# Patient Record
Sex: Female | Born: 1980 | Hispanic: No | Marital: Single | Smoking: Never smoker
Health system: Southern US, Community
[De-identification: ages and names within clinical notes are randomized; demographics above are authoritative.]

## PROBLEM LIST (undated history)

## (undated) DIAGNOSIS — F4312 Post-traumatic stress disorder, chronic: Secondary | ICD-10-CM

## (undated) DIAGNOSIS — G8929 Other chronic pain: Secondary | ICD-10-CM

## (undated) DIAGNOSIS — F329 Major depressive disorder, single episode, unspecified: Secondary | ICD-10-CM

## (undated) DIAGNOSIS — K30 Functional dyspepsia: Secondary | ICD-10-CM

## (undated) DIAGNOSIS — E7800 Pure hypercholesterolemia, unspecified: Secondary | ICD-10-CM

## (undated) DIAGNOSIS — F330 Major depressive disorder, recurrent, mild: Secondary | ICD-10-CM

## (undated) DIAGNOSIS — I10 Essential (primary) hypertension: Secondary | ICD-10-CM

## (undated) DIAGNOSIS — K5900 Constipation, unspecified: Secondary | ICD-10-CM

## (undated) DIAGNOSIS — R519 Headache, unspecified: Secondary | ICD-10-CM

## (undated) DIAGNOSIS — K219 Gastro-esophageal reflux disease without esophagitis: Secondary | ICD-10-CM

## (undated) DIAGNOSIS — R51 Headache: Secondary | ICD-10-CM

## (undated) DIAGNOSIS — F32A Depression, unspecified: Secondary | ICD-10-CM

## (undated) DIAGNOSIS — I499 Cardiac arrhythmia, unspecified: Secondary | ICD-10-CM

## (undated) DIAGNOSIS — G43019 Migraine without aura, intractable, without status migrainosus: Secondary | ICD-10-CM

## (undated) DIAGNOSIS — D649 Anemia, unspecified: Secondary | ICD-10-CM

## (undated) DIAGNOSIS — G43909 Migraine, unspecified, not intractable, without status migrainosus: Secondary | ICD-10-CM

## (undated) DIAGNOSIS — F419 Anxiety disorder, unspecified: Secondary | ICD-10-CM

## (undated) DIAGNOSIS — M549 Dorsalgia, unspecified: Secondary | ICD-10-CM

## (undated) DIAGNOSIS — R0600 Dyspnea, unspecified: Secondary | ICD-10-CM

## (undated) HISTORY — DX: Pure hypercholesterolemia, unspecified: E78.00

## (undated) HISTORY — DX: Migraine, unspecified, not intractable, without status migrainosus: G43.909

## (undated) HISTORY — DX: Major depressive disorder, recurrent, mild: F33.0

## (undated) HISTORY — DX: Migraine without aura, intractable, without status migrainosus: G43.019

## (undated) HISTORY — PX: IUD REMOVAL: SHX5392

## (undated) HISTORY — DX: Essential (primary) hypertension: I10

## (undated) HISTORY — DX: Post-traumatic stress disorder, chronic: F43.12

---

## 2016-02-07 ENCOUNTER — Encounter (HOSPITAL_COMMUNITY): Payer: Self-pay

## 2016-02-07 ENCOUNTER — Emergency Department (HOSPITAL_COMMUNITY)
Admission: EM | Admit: 2016-02-07 | Discharge: 2016-02-07 | Disposition: A | Discharge status: Home / Self Care | Payer: Managed Care, Other (non HMO) | Attending: Emergency Medicine | Admitting: Emergency Medicine

## 2016-02-07 DIAGNOSIS — Z79899 Other long term (current) drug therapy: Secondary | ICD-10-CM | POA: Diagnosis not present

## 2016-02-07 DIAGNOSIS — R197 Diarrhea, unspecified: Secondary | ICD-10-CM | POA: Diagnosis not present

## 2016-02-07 DIAGNOSIS — R112 Nausea with vomiting, unspecified: Secondary | ICD-10-CM

## 2016-02-07 DIAGNOSIS — F17210 Nicotine dependence, cigarettes, uncomplicated: Secondary | ICD-10-CM | POA: Insufficient documentation

## 2016-02-07 HISTORY — DX: Anemia, unspecified: D64.9

## 2016-02-07 LAB — URINALYSIS, ROUTINE W REFLEX MICROSCOPIC
BILIRUBIN URINE: NEGATIVE
Glucose, UA: NEGATIVE mg/dL
Hgb urine dipstick: NEGATIVE
KETONES UR: NEGATIVE mg/dL
LEUKOCYTES UA: NEGATIVE
NITRITE: NEGATIVE
PROTEIN: NEGATIVE mg/dL
Specific Gravity, Urine: 1.022 (ref 1.005–1.030)
pH: 6.5 (ref 5.0–8.0)

## 2016-02-07 LAB — CBC
HEMATOCRIT: 36 % (ref 36.0–46.0)
Hemoglobin: 12.2 g/dL (ref 12.0–15.0)
MCH: 29.7 pg (ref 26.0–34.0)
MCHC: 33.9 g/dL (ref 30.0–36.0)
MCV: 87.6 fL (ref 78.0–100.0)
PLATELETS: 414 10*3/uL — AB (ref 150–400)
RBC: 4.11 MIL/uL (ref 3.87–5.11)
RDW: 13.1 % (ref 11.5–15.5)
WBC: 7.3 10*3/uL (ref 4.0–10.5)

## 2016-02-07 LAB — COMPREHENSIVE METABOLIC PANEL
ALK PHOS: 48 U/L (ref 38–126)
ALT: 11 U/L — ABNORMAL LOW (ref 14–54)
AST: 14 U/L — AB (ref 15–41)
Albumin: 3.9 g/dL (ref 3.5–5.0)
Anion gap: 7 (ref 5–15)
BILIRUBIN TOTAL: 0.3 mg/dL (ref 0.3–1.2)
BUN: 9 mg/dL (ref 6–20)
CALCIUM: 9.3 mg/dL (ref 8.9–10.3)
CO2: 24 mmol/L (ref 22–32)
CREATININE: 0.84 mg/dL (ref 0.44–1.00)
Chloride: 102 mmol/L (ref 101–111)
GFR calc Af Amer: >60 mL/min (ref >60)
Glucose, Bld: 135 mg/dL — ABNORMAL HIGH (ref 65–99)
POTASSIUM: 3.7 mmol/L (ref 3.5–5.1)
Sodium: 133 mmol/L — ABNORMAL LOW (ref 135–145)
TOTAL PROTEIN: 8.1 g/dL (ref 6.5–8.1)

## 2016-02-07 LAB — PREGNANCY, URINE: PREG TEST UR: NEGATIVE

## 2016-02-07 LAB — LIPASE, BLOOD: Lipase: 13 U/L (ref 11–51)

## 2016-02-07 MED ORDER — DICYCLOMINE HCL 10 MG PO CAPS
10.0000 mg | ORAL_CAPSULE | Freq: Once | ORAL | Status: AC
  Administered 2016-02-07: 10 mg via ORAL
  Filled 2016-02-07: qty 1

## 2016-02-07 MED ORDER — ONDANSETRON 4 MG PO TBDP
4.0000 mg | ORAL_TABLET | Freq: Once | ORAL | Status: AC | PRN
  Administered 2016-02-07: 4 mg via ORAL
  Filled 2016-02-07: qty 1

## 2016-02-07 MED ORDER — DICYCLOMINE HCL 20 MG PO TABS: 20.0000 mg | ORAL_TABLET | Freq: Two times a day (BID) | ORAL | Status: DC

## 2016-02-07 MED ORDER — ONDANSETRON HCL 4 MG PO TABS: 4.0000 mg | ORAL_TABLET | Freq: Four times a day (QID) | ORAL | Status: DC

## 2016-02-07 NOTE — ED Notes (Signed)
Per EMS patient c/o sudden onset Nausea x2 hours ago.  Per EMS patient vomited in truck, patient stated it was due to the motion of the truck.  Per EMS patient is clammy to the touch.  Per EMS patient recently diagnosed anemia.  Hx of anxiety and depression.  VS en route 142/86, hR 70, RR 18.

## 2016-02-07 NOTE — ED Provider Notes (Signed)
CSN: [id number]     Arrival date & time 02/07/16  0609 History   First MD Initiated Contact with Patient 02/07/16 [phone]     Chief Complaint  Patient presents with  . Nausea  . Emesis     (Consider location/radiation/quality/duration/timing/severity/associated sxs/prior Treatment) HPI Deanna Ballard is a 35 y.o. female reports she was recently diagnosed with anemia here for evaluation of nausea and vomiting. Patient reports yesterday evening she began to have some central cramping in her mid abdomen that she characterized as "someone grabbing me". She reports this sensation settled in her epigastric area. She reports at approximately 1:30 AM she experienced nausea with vomiting, nonbloody and nonbilious. She also reports 3 loose stools, no melena or hematochezia and she reports prior to this event occurring, she had a gyro. Reports associated hot and cold flashes at home with subjective fevers. She denies any headache, vision changes, cough, chest pain or shortness of breath, urinary symptoms, back pain, unusual vaginal bleeding or discharge. Last normal period last month. Denies any excessive alcohol drinking.  Past Medical History  Diagnosis Date  . Anemia    History reviewed. No pertinent past surgical history. No family history on file. Social History  Substance Use Topics  . Smoking status: Current Every Day Smoker    Types: Cigarettes  . Smokeless tobacco: None  . Alcohol Use: Yes     Comment: socially   OB History    No data available     Review of Systems A 10 point review of systems was completed and was negative except for pertinent positives and negatives as mentioned in the history of present illness     Allergies  Review of patient's allergies indicates no known allergies.  Home Medications   Prior to Admission medications   Medication Sig Start Date End Date Taking? Authorizing Provider  Adapalene 0.3 % gel Apply 1 application topically daily. 01/13/16  Yes  Historical Provider, MD  BELVIQ 10 MG TABS Take 10 mg by mouth daily. 01/18/16  Yes Historical Provider, MD  clonazePAM (KLONOPIN) 0.5 MG tablet Take 1 tablet by mouth at bedtime. 12/08/15  Yes Historical Provider, MD  LORYNA 3-0.02 MG tablet Take 1 tablet by mouth at bedtime. 11/28/15  Yes Historical Provider, MD  oxybutynin (DITROPAN-XL) 5 MG 24 hr tablet Take 1 tablet by mouth at bedtime. 01/13/16  Yes Historical Provider, MD  sertraline (ZOLOFT) 50 MG tablet Take 50 mg by mouth daily. 01/03/16  Yes Historical Provider, MD  dicyclomine (BENTYL) 20 MG tablet Take 1 tablet (20 mg total) by mouth 2 (two) times daily. 02/07/16   Comer Locket, PA-C  ondansetron (ZOFRAN) 4 MG tablet Take 1 tablet (4 mg total) by mouth every 6 (six) hours. 02/07/16   Comer Locket, PA-C  Vitamin D, Ergocalciferol, (DRISDOL) 50000 units CAPS capsule Take 1 capsule by mouth every 7 (seven) days. 02/05/16   Historical Provider, MD   BP 117/76 mmHg  Pulse 93  Temp(Src) 98.3 F (36.8 C) (Oral)  Resp 18  SpO2 100%  LMP 01/04/2016 (Approximate) Physical Exam  Constitutional: She is oriented to person, place, and time. She appears well-developed and well-nourished.  Overweight African-American female  HENT:  Head: Normocephalic and atraumatic.  Mouth/Throat: Oropharynx is clear and moist.  Eyes: Conjunctivae are normal. Pupils are equal, round, and reactive to light. Right eye exhibits no discharge. Left eye exhibits no discharge. No scleral icterus.  Neck: Neck supple.  Cardiovascular: Normal rate, regular rhythm and normal heart sounds.  Pulmonary/Chest: Effort normal and breath sounds normal. No respiratory distress. She has no wheezes. She has no rales.  Abdominal: Soft.  Mild diffuse tenderness in epigastrium, mid abdomen and suprapubic region. Abdomen is otherwise soft, nondistended without rebound or guarding. No pulsatile masses or other deformities noted.  Musculoskeletal: She exhibits no tenderness.   Neurological: She is alert and oriented to person, place, and time.  Cranial Nerves II-XII grossly intact  Skin: Skin is warm and dry. No rash noted.  Psychiatric: She has a normal mood and affect.  Nursing note and vitals reviewed.   ED Course  Procedures (including critical care time) Labs Review Labs Reviewed  COMPREHENSIVE METABOLIC PANEL - Abnormal; Notable for the following:    Sodium 133 (*)    Glucose, Bld 135 (*)    AST 14 (*)    ALT 11 (*)    All other components within normal limits  CBC - Abnormal; Notable for the following:    Platelets 414 (*)    All other components within normal limits  URINALYSIS, ROUTINE W REFLEX MICROSCOPIC (NOT AT [facility]) - Abnormal; Notable for the following:    APPearance HAZY (*)    All other components within normal limits  LIPASE, BLOOD  PREGNANCY, URINE    Imaging Review No results found. I have personally reviewed and evaluated these images and lab results as part of my medical decision-making.   EKG Interpretation None     Meds given in ED:  Medications  ondansetron (ZOFRAN-ODT) disintegrating tablet 4 mg (4 mg Oral Given 02/07/16 0825)  dicyclomine (BENTYL) capsule 10 mg (10 mg Oral Given 02/07/16 1001)    Discharge Medication List as of 02/07/2016 11:28 AM    START taking these medications   Details  dicyclomine (BENTYL) 20 MG tablet Take 1 tablet (20 mg total) by mouth 2 (two) times daily., Starting 02/07/2016, Until Discontinued, Print    ondansetron (ZOFRAN) 4 MG tablet Take 1 tablet (4 mg total) by mouth every 6 (six) hours., Starting 02/07/2016, Until Discontinued, Print       Filed Vitals:   02/07/16 0623 02/07/16 0847 02/07/16 1122  BP: 137/99 120/73 117/76  Pulse: 69 88 93  Temp: 98.3 F (36.8 C)    TempSrc: Oral    Resp: 18 18 18   SpO2: 100% 99% 100%    MDM  Patient presents with nausea, vomiting and diarrhea over the past 24 hours. Physical exam is reassuring. Abdominal exam without any evidence of  acute or surgical abdomen. On arrival, she is afebrile and hemodynamically stable. Overall she appears well and nontoxic. Screening labs are unremarkable, urine without evidence of infection. Pregnancy test is negative. Patient reports symptoms have greatly improved with Zofran and Bentyl. She is tolerating oral fluids. Plan to discharge with prescriptions for both and follow-up with PCP for reevaluation next week. Discussed strict return precautions. She verbalizes understanding and agrees with this plan as well as subsequent discharge. Final diagnoses:  Nausea vomiting and diarrhea        Comer Locket, PA-C 02/07/16 1341  Harvel Quale, MD 02/14/16 1447

## 2016-02-07 NOTE — Discharge Instructions (Signed)
There does not appear to be an emergent cause for your symptoms at this time. Please take your medications as prescribed to help with your nausea and vomiting and abdominal discomfort. Follow-up with your doctor next week for reevaluation. Return to ED for any new or worsening symptoms as we discussed.  Nausea and Vomiting Nausea is a sick feeling that often comes before throwing up (vomiting). Vomiting is a reflex where stomach contents come out of your mouth. Vomiting can cause severe loss of body fluids (dehydration). Children and elderly adults can become dehydrated quickly, especially if they also have diarrhea. Nausea and vomiting are symptoms of a condition or disease. It is important to find the cause of your symptoms. CAUSES   Direct irritation of the stomach lining. This irritation can result from increased acid production (gastroesophageal reflux disease), infection, food poisoning, taking certain medicines (such as nonsteroidal anti-inflammatory drugs), alcohol use, or tobacco use.  Signals from the brain.These signals could be caused by a headache, heat exposure, an inner ear disturbance, increased pressure in the brain from injury, infection, a tumor, or a concussion, pain, emotional stimulus, or metabolic problems.  An obstruction in the gastrointestinal tract (bowel obstruction).  Illnesses such as diabetes, hepatitis, gallbladder problems, appendicitis, kidney problems, cancer, sepsis, atypical symptoms of a heart attack, or eating disorders.  Medical treatments such as chemotherapy and radiation.  Receiving medicine that makes you sleep (general anesthetic) during surgery. DIAGNOSIS Your caregiver may ask for tests to be done if the problems do not improve after a few days. Tests may also be done if symptoms are severe or if the reason for the nausea and vomiting is not clear. Tests may include:  Urine tests.  Blood tests.  Stool tests.  Cultures (to look for evidence of  infection).  X-rays or other imaging studies. Test results can help your caregiver make decisions about treatment or the need for additional tests. TREATMENT You need to stay well hydrated. Drink frequently but in small amounts.You may wish to drink water, sports drinks, clear broth, or eat frozen ice pops or gelatin dessert to help stay hydrated.When you eat, eating slowly may help prevent nausea.There are also some antinausea medicines that may help prevent nausea. HOME CARE INSTRUCTIONS   Take all medicine as directed by your caregiver.  If you do not have an appetite, do not force yourself to eat. However, you must continue to drink fluids.  If you have an appetite, eat a normal diet unless your caregiver tells you differently.  Eat a variety of complex carbohydrates (rice, wheat, potatoes, bread), lean meats, yogurt, fruits, and vegetables.  Avoid high-fat foods because they are more difficult to digest.  Drink enough water and fluids to keep your urine clear or pale yellow.  If you are dehydrated, ask your caregiver for specific rehydration instructions. Signs of dehydration may include:  Severe thirst.  Dry lips and mouth.  Dizziness.  Dark urine.  Decreasing urine frequency and amount.  Confusion.  Rapid breathing or pulse. SEEK IMMEDIATE MEDICAL CARE IF:   You have blood or brown flecks (like coffee grounds) in your vomit.  You have black or bloody stools.  You have a severe headache or stiff neck.  You are confused.  You have severe abdominal pain.  You have chest pain or trouble breathing.  You do not urinate at least once every 8 hours.  You develop cold or clammy skin.  You continue to vomit for longer than 24 to 48 hours.  You have a fever. MAKE SURE YOU:   Understand these instructions.  Will watch your condition.  Will get help right away if you are not doing well or get worse.   This information is not intended to replace advice  given to you by your health care provider. Make sure you discuss any questions you have with your health care provider.   Document Released: 09/05/2005 Document Revised: 11/28/2011 Document Reviewed: 02/02/2011 Elsevier Interactive Patient Education [employer].

## 2016-02-07 NOTE — ED Notes (Signed)
Lab draw unsuccessful x2.  

## 2016-09-05 ENCOUNTER — Other Ambulatory Visit: Payer: Self-pay | Admitting: Obstetrics and Gynecology

## 2016-09-06 NOTE — H&P (Signed)
Deanna Ballard is a 35 y.o. female  G:0 who presents for removal of a right ovary  and an endometrial polyp because of menorrhagia and a large right dermoid cyst.  Since menarche the patient gives a history of heavy menses that,  early on,  was managed with oral contraceptives.  In recent years the bleeding has resumed with her flow lasting up to 2 weeks and having to change her pad every hour to once a day as it tapers.  She admits to passing large clots and at times going up to 3 months without a period.  With her flow she has cramping rated 9-10 on a 10 point pain scale that is relieved with Naproxen.  She denies inter-menstrual bleeding, changes in bowel or bladder function or dyspareunia.  A pelvic ultrasound, November 2017 showed: uterus: anteverted with features of adenomyosis measuring, 6.69 x 3.41 x 3.91 cm, endometrium: 0.44 cm with a trace of free fluid in the cavity and focal thickening vs polyp measuring 1.0 cm; uterine fibroid 0.99 cm; left ovary: 3.68 cm and right ovary: 7.22 cm with a large mass containing solid and cystic components ? dermoid measuring: 7.6 cm.  Patient's gonorrhea and chlamydia cultures were negative, CA-125 normal with an elevated CEA at 3.1.  A removal of the ovarian cyst was recommended however,  the patient prefers to have her entire right ovary and tube removed along with the endometrial mass and has consented to the same.   Past Medical History  OB History: G: 0  GYN History: menarche: 35 YO    LMP: 08/27/16    Contracepton no method  The patient denies history of sexually transmitted disease.  Denies history of abnormal PAP smea.   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   ROS: wears glasses, has shortness of breath (with and without exertion)-awaiting medical clearance and  palpitations  but denies,  headache, vision changes, nasal congestion, dysphagia, tinnitus, dizziness, hoarseness, cough,  chest pain, shortness of breath, nausea, vomiting, diarrhea,constipation,  urinary frequency, urgency  dysuria, hematuria, vaginitis symptoms,  swelling of joints,easy bruising,  myalgias, arthralgias, skin rashes, unexplained weight loss and except as is mentioned in the history of present illness, patient's review of systems is otherwise negative.  Physical Exam  Bp:  122/72    P: 96 bpm   R: 20    Temperature: 98.3 degrees F   Weight: 263 lbs.  Height: 5' 9.5 "  BMI  Neck: supple without masses or thyromegaly Lungs: clear to auscultation Heart: regular rate and rhythm Abdomen: soft, right lower quadrant tenderness and no organomegaly Pelvic:EGBUS- wnl; vagina-normal rugae; uterus-normal size, cervix without lesions or motion tenderness; adnexae-right tenderness with fullness   Extremities:  no clubbing, cyanosis or edema   Assesment:  Right Dermoid Cyst            Endometrial Mass  Menorrhagia   Disposition:  A discussion was held with patient regarding the indication for her procedure(s) along with the risks, which include but are not limited to: reaction to anesthesia, damage to adjacent organs (to include perforation), infection and  excessive bleeding. The patient verbalized understanding of these risks and has consented to proceed with a Laparoscopic Right  Salpingo-Oophorectomy, Hysteroscopic Removal of Endometrial Mass and Placement of a Mirena IUD at [place] on September 26, 2016 at 12:45 p.m.  CSN# [id number]   Jhordan Kinter J. Florene Glen, PA-C  for Dr. Dede Query. Rivard

## 2016-09-13 NOTE — Patient Instructions (Addendum)
Your procedure is scheduled on:  Monday, Jan. 8, 2018  Enter through the [employer] [facility] at:  12:15 PM  Pick up the phone at the desk and dial [phone].  Call this number if you have problems the morning of surgery: [phone].  Remember: Do NOT eat food:  After Midnight Sunday, Jan. 7, 2018  Do NOT drink clear liquids after:  9:30 AM day of surgery  Take these medicines the morning of surgery with a SIP OF WATER: None  Stop ALL herbal medications at this time   Do NOT wear jewelry (body piercing), metal hair clips/bobby pins, make-up, or nail polish. Do NOT wear lotions, powders, or perfumes.  You may wear deodorant. Do NOT shave for 48 hours prior to surgery. Do NOT bring valuables to the hospital. Contacts, dentures, or bridgework may not be worn into surgery.  Have a responsible adult drive you home and stay with you for 24 hours after your procedure

## 2016-09-14 ENCOUNTER — Other Ambulatory Visit: Payer: Self-pay

## 2016-09-14 ENCOUNTER — Encounter (HOSPITAL_COMMUNITY)
Admission: RE | Admit: 2016-09-14 | Discharge: 2016-09-14 | Disposition: A | Discharge status: Home / Self Care | Payer: Managed Care, Other (non HMO) | Source: Ambulatory Visit | Attending: Obstetrics and Gynecology | Admitting: Obstetrics and Gynecology

## 2016-09-14 ENCOUNTER — Encounter (HOSPITAL_COMMUNITY): Payer: Self-pay

## 2016-09-14 DIAGNOSIS — R51 Headache: Secondary | ICD-10-CM | POA: Diagnosis not present

## 2016-09-14 DIAGNOSIS — D649 Anemia, unspecified: Secondary | ICD-10-CM | POA: Diagnosis not present

## 2016-09-14 DIAGNOSIS — K219 Gastro-esophageal reflux disease without esophagitis: Secondary | ICD-10-CM | POA: Insufficient documentation

## 2016-09-14 DIAGNOSIS — Z01812 Encounter for preprocedural laboratory examination: Secondary | ICD-10-CM | POA: Insufficient documentation

## 2016-09-14 DIAGNOSIS — R009 Unspecified abnormalities of heart beat: Secondary | ICD-10-CM | POA: Insufficient documentation

## 2016-09-14 DIAGNOSIS — K30 Functional dyspepsia: Secondary | ICD-10-CM | POA: Insufficient documentation

## 2016-09-14 DIAGNOSIS — R0600 Dyspnea, unspecified: Secondary | ICD-10-CM | POA: Insufficient documentation

## 2016-09-14 DIAGNOSIS — F329 Major depressive disorder, single episode, unspecified: Secondary | ICD-10-CM | POA: Insufficient documentation

## 2016-09-14 DIAGNOSIS — Z01810 Encounter for preprocedural cardiovascular examination: Secondary | ICD-10-CM | POA: Insufficient documentation

## 2016-09-14 DIAGNOSIS — R000 Tachycardia, unspecified: Secondary | ICD-10-CM | POA: Diagnosis not present

## 2016-09-14 HISTORY — DX: Depression, unspecified: F32.A

## 2016-09-14 HISTORY — DX: Headache: R51

## 2016-09-14 HISTORY — DX: Major depressive disorder, single episode, unspecified: F32.9

## 2016-09-14 HISTORY — DX: Gastro-esophageal reflux disease without esophagitis: K21.9

## 2016-09-14 HISTORY — DX: Dyspnea, unspecified: R06.00

## 2016-09-14 HISTORY — DX: Headache, unspecified: R51.9

## 2016-09-14 HISTORY — DX: Cardiac arrhythmia, unspecified: I49.9

## 2016-09-14 HISTORY — DX: Functional dyspepsia: K30

## 2016-09-14 LAB — CBC
HCT: 36.1 % (ref 36.0–46.0)
HEMOGLOBIN: 12.3 g/dL (ref 12.0–15.0)
MCH: 30.5 pg (ref 26.0–34.0)
MCHC: 34.1 g/dL (ref 30.0–36.0)
MCV: 89.6 fL (ref 78.0–100.0)
PLATELETS: 392 10*3/uL (ref 150–400)
RBC: 4.03 MIL/uL (ref 3.87–5.11)
RDW: 13.5 % (ref 11.5–15.5)
WBC: 5.3 10*3/uL (ref 4.0–10.5)

## 2016-09-26 ENCOUNTER — Encounter (HOSPITAL_COMMUNITY): Payer: Self-pay

## 2016-09-26 ENCOUNTER — Ambulatory Visit (HOSPITAL_COMMUNITY)
Admission: RE | Admit: 2016-09-26 | Discharge: 2016-09-26 | Disposition: A | Discharge status: Home / Self Care | Payer: Managed Care, Other (non HMO) | Source: Ambulatory Visit | Attending: Obstetrics and Gynecology | Admitting: Obstetrics and Gynecology

## 2016-09-26 ENCOUNTER — Ambulatory Visit (HOSPITAL_COMMUNITY): Payer: Managed Care, Other (non HMO) | Admitting: Anesthesiology

## 2016-09-26 ENCOUNTER — Encounter (HOSPITAL_COMMUNITY)
Admission: RE | Disposition: A | Discharge status: Home / Self Care | Payer: Self-pay | Source: Ambulatory Visit | Attending: Obstetrics and Gynecology

## 2016-09-26 DIAGNOSIS — K219 Gastro-esophageal reflux disease without esophagitis: Secondary | ICD-10-CM | POA: Diagnosis not present

## 2016-09-26 DIAGNOSIS — D279 Benign neoplasm of unspecified ovary: Secondary | ICD-10-CM | POA: Diagnosis present

## 2016-09-26 DIAGNOSIS — N800 Endometriosis of uterus: Secondary | ICD-10-CM | POA: Insufficient documentation

## 2016-09-26 DIAGNOSIS — N920 Excessive and frequent menstruation with regular cycle: Secondary | ICD-10-CM | POA: Diagnosis not present

## 2016-09-26 DIAGNOSIS — N840 Polyp of corpus uteri: Secondary | ICD-10-CM | POA: Diagnosis not present

## 2016-09-26 DIAGNOSIS — F329 Major depressive disorder, single episode, unspecified: Secondary | ICD-10-CM | POA: Diagnosis not present

## 2016-09-26 HISTORY — PX: INTRAUTERINE DEVICE (IUD) INSERTION: SHX5877

## 2016-09-26 HISTORY — PX: HYSTEROSCOPY WITH D & C: SHX1775

## 2016-09-26 HISTORY — PX: ROBOTIC ASSISTED LAPAROSCOPIC OVARIAN CYSTECTOMY: SHX6081

## 2016-09-26 LAB — PREGNANCY, URINE: PREG TEST UR: NEGATIVE

## 2016-09-26 SURGERY — ROBOTIC ASSISTED LAPAROSCOPIC OVARIAN CYSTECTOMY
Anesthesia: General | Site: Uterus | Laterality: Right

## 2016-09-26 MED ORDER — BUPIVACAINE HCL (PF) 0.25 % IJ SOLN
INTRAMUSCULAR | Status: AC
  Filled 2016-09-26: qty 30

## 2016-09-26 MED ORDER — ROCURONIUM BROMIDE 100 MG/10ML IV SOLN
INTRAVENOUS | Status: DC | PRN
Start: 2016-09-26 — End: 2016-09-26
  Administered 2016-09-26: 20 mg via INTRAVENOUS
  Administered 2016-09-26 (×3): 10 mg via INTRAVENOUS
  Administered 2016-09-26: 50 mg via INTRAVENOUS
  Administered 2016-09-26: 10 mg via INTRAVENOUS
  Administered 2016-09-26: 5 mg via INTRAVENOUS

## 2016-09-26 MED ORDER — KETOROLAC TROMETHAMINE 30 MG/ML IJ SOLN
INTRAMUSCULAR | Status: AC
  Filled 2016-09-26: qty 2

## 2016-09-26 MED ORDER — LACTATED RINGERS IV SOLN
INTRAVENOUS | Status: DC
  Administered 2016-09-26: 15:00:00 via INTRAVENOUS
  Administered 2016-09-26: 125 mL/h via INTRAVENOUS
  Administered 2016-09-26: 13:00:00 via INTRAVENOUS

## 2016-09-26 MED ORDER — HYDROMORPHONE HCL 1 MG/ML IJ SOLN
0.2500 mg | INTRAMUSCULAR | Status: DC | PRN
  Administered 2016-09-26 (×2): 0.25 mg via INTRAVENOUS

## 2016-09-26 MED ORDER — NEOSTIGMINE METHYLSULFATE 10 MG/10ML IV SOLN
INTRAVENOUS | Status: DC | PRN
  Administered 2016-09-26: 4 mg via INTRAVENOUS

## 2016-09-26 MED ORDER — OXYCODONE-ACETAMINOPHEN 5-325 MG PO TABS: 1.0000 | ORAL_TABLET | ORAL | 0 refills | Status: DC | PRN

## 2016-09-26 MED ORDER — PROMETHAZINE HCL 25 MG/ML IJ SOLN: 6.2500 mg | INTRAMUSCULAR | Status: DC | PRN

## 2016-09-26 MED ORDER — SODIUM CHLORIDE 0.9 % IJ SOLN
INTRAMUSCULAR | Status: AC
  Filled 2016-09-26: qty 60

## 2016-09-26 MED ORDER — HYDROMORPHONE HCL 1 MG/ML IJ SOLN
INTRAMUSCULAR | Status: AC
  Filled 2016-09-26: qty 1

## 2016-09-26 MED ORDER — LACTATED RINGERS IR SOLN
Status: DC | PRN
  Administered 2016-09-26: 3000 mL

## 2016-09-26 MED ORDER — MIDAZOLAM HCL 2 MG/2ML IJ SOLN
INTRAMUSCULAR | Status: DC | PRN
  Administered 2016-09-26: 2 mg via INTRAVENOUS

## 2016-09-26 MED ORDER — PROPOFOL 10 MG/ML IV BOLUS
INTRAVENOUS | Status: DC | PRN
  Administered 2016-09-26: 200 mg via INTRAVENOUS

## 2016-09-26 MED ORDER — NEOSTIGMINE METHYLSULFATE 10 MG/10ML IV SOLN
INTRAVENOUS | Status: AC
  Filled 2016-09-26: qty 1

## 2016-09-26 MED ORDER — KETOROLAC TROMETHAMINE 30 MG/ML IJ SOLN: 30.0000 mg | Freq: Once | INTRAMUSCULAR | Status: DC | PRN

## 2016-09-26 MED ORDER — FENTANYL CITRATE (PF) 250 MCG/5ML IJ SOLN
INTRAMUSCULAR | Status: DC | PRN
  Administered 2016-09-26 (×2): 100 ug via INTRAVENOUS
  Administered 2016-09-26 (×2): 50 ug via INTRAVENOUS

## 2016-09-26 MED ORDER — CHLOROPROCAINE HCL 1 % IJ SOLN
INTRAMUSCULAR | Status: AC
  Filled 2016-09-26: qty 30

## 2016-09-26 MED ORDER — VASOPRESSIN 20 UNIT/ML IV SOLN
INTRAVENOUS | Status: DC | PRN
  Administered 2016-09-26: 10 mL via INTRAMUSCULAR

## 2016-09-26 MED ORDER — STERILE WATER FOR IRRIGATION IR SOLN
Status: DC | PRN
  Administered 2016-09-26: 1000 mL

## 2016-09-26 MED ORDER — GLYCOPYRROLATE 0.2 MG/ML IJ SOLN
INTRAMUSCULAR | Status: DC | PRN
  Administered 2016-09-26: 0.1 mg via INTRAVENOUS
  Administered 2016-09-26: .8 mg via INTRAVENOUS

## 2016-09-26 MED ORDER — DEXAMETHASONE SODIUM PHOSPHATE 10 MG/ML IJ SOLN
INTRAMUSCULAR | Status: AC
  Filled 2016-09-26: qty 1

## 2016-09-26 MED ORDER — ONDANSETRON HCL 4 MG/2ML IJ SOLN
INTRAMUSCULAR | Status: AC
  Filled 2016-09-26: qty 2

## 2016-09-26 MED ORDER — VASOPRESSIN 20 UNIT/ML IV SOLN
INTRAVENOUS | Status: AC
  Filled 2016-09-26: qty 1

## 2016-09-26 MED ORDER — CHLOROPROCAINE HCL 1 % IJ SOLN
INTRAMUSCULAR | Status: DC | PRN
  Administered 2016-09-26: 10 mL

## 2016-09-26 MED ORDER — PROPOFOL 10 MG/ML IV BOLUS
INTRAVENOUS | Status: AC
  Filled 2016-09-26: qty 20

## 2016-09-26 MED ORDER — MIDAZOLAM HCL 2 MG/2ML IJ SOLN
INTRAMUSCULAR | Status: AC
  Filled 2016-09-26: qty 2

## 2016-09-26 MED ORDER — OXYMETAZOLINE HCL 0.05 % NA SOLN
1.0000 | Freq: Two times a day (BID) | NASAL | Status: DC
  Administered 2016-09-26: 1 via NASAL
  Filled 2016-09-26: qty 15

## 2016-09-26 MED ORDER — FENTANYL CITRATE (PF) 100 MCG/2ML IJ SOLN
INTRAMUSCULAR | Status: AC
  Filled 2016-09-26: qty 2

## 2016-09-26 MED ORDER — LIDOCAINE HCL (CARDIAC) 20 MG/ML IV SOLN
INTRAVENOUS | Status: DC | PRN
  Administered 2016-09-26: 100 mg via INTRAVENOUS

## 2016-09-26 MED ORDER — ROPIVACAINE HCL 5 MG/ML IJ SOLN
INTRAMUSCULAR | Status: AC
  Filled 2016-09-26: qty 60

## 2016-09-26 MED ORDER — DEXAMETHASONE SODIUM PHOSPHATE 4 MG/ML IJ SOLN
INTRAMUSCULAR | Status: DC | PRN
  Administered 2016-09-26: 10 mg via INTRAVENOUS

## 2016-09-26 MED ORDER — GLYCOPYRROLATE 0.2 MG/ML IJ SOLN
INTRAMUSCULAR | Status: AC
  Filled 2016-09-26: qty 4

## 2016-09-26 MED ORDER — SCOPOLAMINE 1 MG/3DAYS TD PT72
1.0000 | MEDICATED_PATCH | Freq: Once | TRANSDERMAL | Status: DC
  Administered 2016-09-26: 1.5 mg via TRANSDERMAL

## 2016-09-26 MED ORDER — ONDANSETRON HCL 4 MG/2ML IJ SOLN
INTRAMUSCULAR | Status: DC | PRN
  Administered 2016-09-26: 4 mg via INTRAVENOUS

## 2016-09-26 MED ORDER — SODIUM CHLORIDE 0.9 % IV SOLN
INTRAVENOUS | Status: DC | PRN
  Administered 2016-09-26: 100 mL

## 2016-09-26 MED ORDER — SCOPOLAMINE 1 MG/3DAYS TD PT72
MEDICATED_PATCH | TRANSDERMAL | Status: AC
  Administered 2016-09-26: 1.5 mg via TRANSDERMAL
  Filled 2016-09-26: qty 1

## 2016-09-26 MED ORDER — HYDROMORPHONE HCL 1 MG/ML IJ SOLN
INTRAMUSCULAR | Status: DC | PRN
  Administered 2016-09-26: 1 mg via INTRAVENOUS

## 2016-09-26 MED ORDER — HYDROMORPHONE HCL 1 MG/ML IJ SOLN
INTRAMUSCULAR | Status: AC
  Administered 2016-09-26: 0.25 mg via INTRAVENOUS
  Filled 2016-09-26: qty 1

## 2016-09-26 MED ORDER — KETOROLAC TROMETHAMINE 30 MG/ML IJ SOLN
INTRAMUSCULAR | Status: DC | PRN
  Administered 2016-09-26: 30 mg via INTRAVENOUS
  Administered 2016-09-26: 30 mg via INTRAMUSCULAR

## 2016-09-26 MED ORDER — FENTANYL CITRATE (PF) 250 MCG/5ML IJ SOLN
INTRAMUSCULAR | Status: AC
  Filled 2016-09-26: qty 5

## 2016-09-26 MED ORDER — IBUPROFEN 600 MG PO TABS: 600.0000 mg | ORAL_TABLET | Freq: Four times a day (QID) | ORAL | 0 refills | Status: DC | PRN

## 2016-09-26 MED ORDER — LIDOCAINE HCL (CARDIAC) 20 MG/ML IV SOLN
INTRAVENOUS | Status: AC
  Filled 2016-09-26: qty 5

## 2016-09-26 SURGICAL SUPPLY — 69 items
BAG DECANTER FOR FLEXI CONT (MISCELLANEOUS) ×8 IMPLANT
BARRIER ADHS 3X4 INTERCEED (GAUZE/BANDAGES/DRESSINGS) IMPLANT
BIPOLAR CUTTING LOOP 21FR (ELECTRODE)
BOOTIES KNEE HIGH SLOAN (MISCELLANEOUS) ×8 IMPLANT
CANISTER SUCT 3000ML (MISCELLANEOUS) ×4 IMPLANT
CATH FOLEY 3WAY  5CC 16FR (CATHETERS) ×2
CATH FOLEY 3WAY 5CC 16FR (CATHETERS) ×2 IMPLANT
CATH ROBINSON RED A/P 16FR (CATHETERS) IMPLANT
CLOTH BEACON ORANGE TIMEOUT ST (SAFETY) ×4 IMPLANT
CONT PATH 16OZ SNAP LID 3702 (MISCELLANEOUS) ×8 IMPLANT
CONTAINER PREFILL 10% NBF 60ML (FORM) ×8 IMPLANT
CORD BIPOLAR FORCEPS 12FT (ELECTRODE) ×4 IMPLANT
COVER BACK TABLE 60X90IN (DRAPES) ×8 IMPLANT
COVER TIP SHEARS 8 DVNC (MISCELLANEOUS) ×2 IMPLANT
COVER TIP SHEARS 8MM DA VINCI (MISCELLANEOUS) ×2
DECANTER SPIKE VIAL GLASS SM (MISCELLANEOUS) ×12 IMPLANT
DEFOGGER SCOPE WARMER CLEARIFY (MISCELLANEOUS) ×8 IMPLANT
DERMABOND ADVANCED (GAUZE/BANDAGES/DRESSINGS) ×2
DERMABOND ADVANCED .7 DNX12 (GAUZE/BANDAGES/DRESSINGS) ×2 IMPLANT
DRSG OPSITE POSTOP 3X4 (GAUZE/BANDAGES/DRESSINGS) ×4 IMPLANT
DURAPREP 26ML APPLICATOR (WOUND CARE) ×4 IMPLANT
ELECT COAG BIPOL BALL 21FR (ELECTRODE) IMPLANT
ELECT REM PT RETURN 9FT ADLT (ELECTROSURGICAL) ×4
ELECTRODE REM PT RTRN 9FT ADLT (ELECTROSURGICAL) ×2 IMPLANT
GAUZE VASELINE 3X9 (GAUZE/BANDAGES/DRESSINGS) IMPLANT
GLOVE BIOGEL PI IND STRL 7.0 (GLOVE) ×6 IMPLANT
GLOVE BIOGEL PI INDICATOR 7.0 (GLOVE) ×6
GLOVE ECLIPSE 6.5 STRL STRAW (GLOVE) ×20 IMPLANT
GOWN STRL REUS W/TWL LRG LVL3 (GOWN DISPOSABLE) ×8 IMPLANT
KIT ACCESSORY DA VINCI DISP (KITS) ×2
KIT ACCESSORY DVNC DISP (KITS) ×2 IMPLANT
LEGGING LITHOTOMY PAIR STRL (DRAPES) ×4 IMPLANT
LOOP CUTTING BIPOLAR 21FR (ELECTRODE) IMPLANT
NEEDLE SPNL 22GX3.5 QUINCKE BK (NEEDLE) ×8 IMPLANT
PACK ROBOT WH (CUSTOM PROCEDURE TRAY) ×4 IMPLANT
PACK ROBOTIC GOWN (GOWN DISPOSABLE) ×4 IMPLANT
PACK TRENDGUARD 450 HYBRID PRO (MISCELLANEOUS) ×2 IMPLANT
PACK TRENDGUARD 600 HYBRD PROC (MISCELLANEOUS) IMPLANT
PACK VAGINAL MINOR WOMEN LF (CUSTOM PROCEDURE TRAY) IMPLANT
PAD OB MATERNITY 4.3X12.25 (PERSONAL CARE ITEMS) ×4 IMPLANT
PAD PREP 24X48 CUFFED NSTRL (MISCELLANEOUS) ×4 IMPLANT
POUCH LAPAROSCOPIC INSTRUMENT (MISCELLANEOUS) ×4 IMPLANT
POUCH SPECIMEN RETRIEVAL 10MM (ENDOMECHANICALS) ×4 IMPLANT
PROTECTOR NERVE ULNAR (MISCELLANEOUS) ×8 IMPLANT
SET CYSTO W/LG BORE CLAMP LF (SET/KITS/TRAYS/PACK) IMPLANT
SET IRRIG TUBING LAPAROSCOPIC (IRRIGATION / IRRIGATOR) ×4 IMPLANT
SET TRI-LUMEN FLTR TB AIRSEAL (TUBING) ×4 IMPLANT
SUT MNCRL AB 3-0 PS2 27 (SUTURE) IMPLANT
SUT VIC AB 0 CT2 27 (SUTURE) IMPLANT
SUT VIC AB 2-0 CT2 27 (SUTURE) ×4 IMPLANT
SUT VICRYL 0 UR6 27IN ABS (SUTURE) ×8 IMPLANT
SUT VLOC 180 0 9IN  GS21 (SUTURE)
SUT VLOC 180 0 9IN GS21 (SUTURE) IMPLANT
SYR 50ML LL SCALE MARK (SYRINGE) ×4 IMPLANT
SYSTEM CONVERTIBLE TROCAR (TROCAR) ×4 IMPLANT
TIP UTERINE 5.1X6CM LAV DISP (MISCELLANEOUS) IMPLANT
TIP UTERINE 6.7X10CM GRN DISP (MISCELLANEOUS) IMPLANT
TIP UTERINE 6.7X6CM WHT DISP (MISCELLANEOUS) IMPLANT
TIP UTERINE 6.7X8CM BLUE DISP (MISCELLANEOUS) ×4 IMPLANT
TOWEL OR 17X24 6PK STRL BLUE (TOWEL DISPOSABLE) ×12 IMPLANT
TRENDGUARD 450 HYBRID PRO PACK (MISCELLANEOUS) ×4
TRENDGUARD 600 HYBRID PROC PK (MISCELLANEOUS)
TROCAR 12M 150ML BLUNT (TROCAR) ×4 IMPLANT
TROCAR DISP BLADELESS 8 DVNC (TROCAR) ×2 IMPLANT
TROCAR DISP BLADELESS 8MM (TROCAR) ×2
TROCAR PORT AIRSEAL 8X120 (TROCAR) ×4 IMPLANT
TUBING AQUILEX INFLOW (TUBING) ×4 IMPLANT
TUBING AQUILEX OUTFLOW (TUBING) ×4 IMPLANT
WATER STERILE IRR 1000ML POUR (IV SOLUTION) ×4 IMPLANT

## 2016-09-26 NOTE — OR Nursing (Signed)
Mirena  Lot # [id number] expiration 02/2019

## 2016-09-26 NOTE — Anesthesia Procedure Notes (Addendum)
Procedure Name: Intubation Date/Time: 09/26/2016 12:56 PM Performed by: Flossie Dibble Pre-anesthesia Checklist: Patient identified, Timeout performed, Emergency Drugs available, Suction available and Patient being monitored Patient Re-evaluated:Patient Re-evaluated prior to inductionOxygen Delivery Method: Circle system utilized Preoxygenation: Pre-oxygenation with 100% oxygen Intubation Type: IV induction Ventilation: Mask ventilation without difficulty and Oral airway inserted - appropriate to patient size Laryngoscope Size: Mac and 4 Grade View: Grade I Tube type: Oral Tube size: 7.0 mm Number of attempts: 1 Airway Equipment and Method: Stylet Placement Confirmation: ETT inserted through vocal cords under direct vision,  positive ETCO2 and breath sounds checked- equal and bilateral Secured at: 22 (at lips) cm Tube secured with: Tape Dental Injury: Teeth and Oropharynx as per pre-operative assessment

## 2016-09-26 NOTE — Transfer of Care (Signed)
Immediate Anesthesia Transfer of Care Note  Patient: Oda Kilts  Procedure(s) Performed: Procedure(s): ROBOTIC ASSISTED LAPAROSCOPIC OVARIAN CYSTECTOMY, PELVIC WASHINGS (Right) DILATATION AND CURETTAGE /HYSTEROSCOPY WITH POLYPECTOMY (N/A) INTRAUTERINE DEVICE (IUD) INSERTION (N/A)  Patient Location: PACU  Anesthesia Type:General  Level of Consciousness: awake, alert  and oriented  Airway & Oxygen Therapy: Patient Spontanous Breathing and Patient connected to nasal cannula oxygen  Post-op Assessment: Report given to RN and Post -op Vital signs reviewed and stable  Post vital signs: Reviewed and stable  Last Vitals:  Vitals:   09/26/16 1006  BP: (!) 127/93  Pulse: 88  Resp: (!) 6  Temp: 37 C    Last Pain:  Vitals:   09/26/16 1006  TempSrc: Oral  PainSc: 2       Patients Stated Pain Goal: 1 ([id number])  Complications: No apparent anesthesia complications

## 2016-09-26 NOTE — Addendum Note (Signed)
Addendum  created 09/26/16 1902 by Effie Berkshire, MD   Sign clinical note

## 2016-09-26 NOTE — Discharge Instructions (Signed)
DISCHARGE INSTRUCTIONS: Laparoscopy  The following instructions have been prepared to help you care for yourself upon your return home today.  Wound care:  Do not get the incision wet for the first 24 hours. The incision should be kept clean and dry.  The Band-Aids or dressings may be removed the day after surgery.  Should the incision become sore, red, and swollen after the first week, check with your doctor.  Personal hygiene:  Shower the day after your procedure.  Activity and limitations:  Do NOT drive or operate any equipment today.  Do NOT lift anything more than 15 pounds for 2-3 weeks after surgery.  Do NOT rest in bed all day.  Walking is encouraged. Walk each day, starting slowly with 5-minute walks 3 or 4 times a day. Slowly increase the length of your walks.  Walk up and down stairs slowly.  Do NOT do strenuous activities, such as golfing, playing tennis, bowling, running, biking, weight lifting, gardening, mowing, or vacuuming for 2-4 weeks. Ask your doctor when it is okay to start.  Diet: Eat a light meal as desired this evening. You may resume your usual diet tomorrow.  Return to work: This is dependent on the type of work you do. For the most part you can return to a desk job within a week of surgery. If you are more active at work, please discuss this with your doctor.  What to expect after your surgery: You may have a slight burning sensation when you urinate on the first day. You may have a very small amount of blood in the urine. Expect to have a small amount of vaginal discharge/light bleeding for 1-2 weeks. It is not unusual to have abdominal soreness and bruising for up to 2 weeks. You may be tired and need more rest for about 1 week. You may experience shoulder pain for 24-72 hours. Lying flat in bed may relieve it.  Call your doctor for any of the following:  Develop a fever of 100.4 or greater  Inability to urinate 6 hours after discharge from  hospital  Severe pain not relieved by pain medications  Persistent of heavy bleeding at incision site  Redness or swelling around incision site after a week  Increasing nausea or vomiting  Patient Signature________________________________________ Nurse Signature_________________________________________POST-OPERATIVE INSTRUCTIONS TO PATIENT  Call [facility]  [phone])  for excessive pain, bleeding or temperature greater than or equal to 100.4 degrees (orally).    No driving for 24 hours No lifting (more than 20 lbs) for 4 weeks No sexual activity for 4 weeks  Pain management:  Use Ibuprofen 600 mg every 6 hours for 5 days and then as needed. Use your pain medication as needed to maintain a pain level at or below 3/10 Use Colace 1-2 capsules per day as long as you are using pain medication to avoid constipation.       Diet: normal  Bathing: may shower day after surgery  Wound Care: keep incisions clean and dry  Return to Dr. Cletis Media on 10/11/16 at 10:30 am  Return to work: To be determined at post operative visit.    [id number] A MD 09/26/2016 12:59 PM

## 2016-09-26 NOTE — OR Nursing (Signed)
Red rash notied around neck, patient stated rash from a necklace she had worn 4 days ago.

## 2016-09-26 NOTE — Interval H&P Note (Signed)
History and Physical Interval Note:  09/26/2016 12:13 PM  Deanna Ballard  has presented today for surgery, with the diagnosis of Right Ovarian Dermoid Cyst, Endometrial Polyp, Adenomyosis  The various methods of treatment have been discussed with the patient and family. After consideration of risks, benefits and other options for treatment, the patient has consented to  Procedure(s): ROBOTIC ASSISTED LAPAROSCOPIC OVARIAN CYSTECTOMY (Right) DILATATION AND CURETTAGE /HYSTEROSCOPY WITH RECTOSCOPE (N/A) INTRAUTERINE DEVICE (IUD) INSERTION (N/A) as a surgical intervention .  The patient's history has been reviewed, patient examined, no change in status, stable for surgery.  I have reviewed the patient's chart and labs.  Questions were answered to the patient's satisfaction.     Jeshua Ransford A

## 2016-09-26 NOTE — Anesthesia Postprocedure Evaluation (Addendum)
Anesthesia Post Note  Patient: Deanna Ballard  Procedure(s) Performed: Procedure(s) (LRB): ROBOTIC ASSISTED LAPAROSCOPIC OVARIAN CYSTECTOMY, PELVIC WASHINGS (Right) DILATATION AND CURETTAGE /HYSTEROSCOPY WITH POLYPECTOMY (N/A) INTRAUTERINE DEVICE (IUD) INSERTION (N/A)  Patient location during evaluation: PACU Anesthesia Type: General Level of consciousness: awake and alert Pain management: pain level controlled Vital Signs Assessment: post-procedure vital signs reviewed and stable Respiratory status: spontaneous breathing, nonlabored ventilation, respiratory function stable and patient connected to nasal cannula oxygen Cardiovascular status: blood pressure returned to baseline and stable Postop Assessment: no signs of nausea or vomiting Anesthetic complications: no Comments: Small lip abrasion noted in PACU. No bleeding, minimal swelling. Recommended ice application.         Last Vitals:  Vitals:   09/26/16 1645 09/26/16 1700  BP: (!) 129/92 (!) 137/94  Pulse: 96 96  Resp: 10 13  Temp:      Last Pain:  Vitals:   09/26/16 1700  TempSrc:   PainSc: 6    Pain Goal: Patients Stated Pain Goal: 1 (09/26/16 1700)               Tiajuana Amass

## 2016-09-26 NOTE — Op Note (Signed)
Preop diagnosis: menorrhagia with endometrial polyp, desire for Mirena contraception and right ovarian dermoid cyst  Postop diagnosis: same  Anesthesia: general  Anesthesiologist: Dr. Ola Spurr  Procedure: Hysteroscopy, resection of endometrial polyps and curettage, insertion of Mirena, robotically assisted right ovarian cystectomy, pelvic washings  Surgeon: Dr. Katharine Look Ifeoluwa Bartz  Assistant: Earnstine Regal P.A.-C  EBL: 50 cc  Fluid deficit: 200 cc  Procedure:  After being informed of the planned procedure with possible complications including but not limited to bleeding, infection, injury to other organs, need for laparotomy, expected hospital stay and recovery, informed consent is obtained and patient is taken to or #7. She is placed in lithotomy position on a Pink pad with both arms padded and tucked on each side and bilateral knee-high sequential compressive devices. She is given general anesthesia with endotracheal intubation without any complication. She is prepped and draped in a sterile fashion. A three-way Foley catheter is inserted in her bladder.    A weighted speculum is inserted in the vagina. The cervix was grasped with a tenaculum forcep placed on the anterior lip.We proceed with a paracervical block using 1% Nesacaine, 10 cc. Uterus is sounded at 8. The cervix is then easily dilated using Hegar dilator until # 25. This allows for easy placement of a diagnostic hysteroscope. With perfusion of Normal Saline at a maximum pressure of 80 mmHg, we are able to evaluate the entire uterine cavity.  Observation: 2 normal tubal ostia and 2 endometrial polyps on the posterior wall of the uterus measuring 1 cm each.  Using hysteroscopic scissors, we section both polyps and remove them. We then remove our instrumentation. Using a sharp curette, we proceed with curettage of the endometrial cavity which returns a small amount of normal-appearing endometrium.  Instruments are then removed.  A  #8 RUMI  intrauterine manipulator is then placed easily.  Trocar placement is decided. We infiltrate  the umbilicus with 10 cc of ropivacaine per protocol and perform a 10 mm semi-elliptical incision which is brought down bluntly to the fascia. The fascia is identified and grasped with Coker forceps. The fascia is incised with Mayo scissors. Peritoneum is entered bluntly. A pursestring suture of 0 Vicryl is placed on the fascia and a 10 mm Hassan trocar is easily inserted in the abdominal cavity held in placed with a Purstring suture. This allows for easy insufflation of a pneumoperitoneum using warmed CO2 at a maximum pressure of 15 mm of mercury. We then placed one 31mm robotic trocar on the left, one 49mm robotic trocar on the right and one 10 mm patient's side assistant trocar on the right after infiltrating every site with ropivacaine per protocol. The robot is docked on the left of the patient after positioning her in Trendelenburg. A monopolar scissor is inserted in arm #1 and a [place]  forcep is inserted in arm #2.  Preparation, hysteroscopic procedure and docking are  completed in 50 minutes.   Observation: Uterus is normal except for a <1cm subserosal fibroid on the posterior wall . Both tubes are normal. Both anterior and posterior cul-de-sac are normal. Liver is visualized and normal. Appendix is visualized and normal. Left ovary is normal. Right ovary has a smooth-wall 8 cm cyst with elongated utero-ovarian ligament and no adhesions. We perform pelvic washings.   We began by infiltrating the serosa of the right ovary with Vasopressine 20 units/100 cc of saline.  Using monopolar scissors, we open the ovarian serosa and proceed with systematic dissection of the ovarian cyst from the  serosa. We use blunt and sharp dissection as well as traction/contra-traction. We free the cyst  with cyst rupture at the end of dissection revealing sebum and hair, compatible with a mature dermoid cyst.   The cyst   is removed from the abdomen with an endobag.   We irrigated profusely to cleanse the pelvis and confirm a satisfactory hemostasis after cauterization of the bleeding sites on the ovarian tissue.Console time is completed in 1 hour and 15 minutes.   All instruments are then removed and the robot is undocked.   All trochars are removed under direct visualization after evacuating the pneumoperitoneum.   The fascia of the supraumbilical incision is closed with the previously placed pursestring suture of 0 Vicryl and a 2nd 0 Vicryl suture on the right side. The fascia of the right patient side assistant trocar is closed with a figure-of-eight stitch of 0 Vicryl. All incisions are then closed with subcuticular suture of 3-0 Monocryl and Dermabond.   A speculum is inserted in the vagina to confirm adequate hemostasis on the cervix. Mirena IUD is easily inserted per protocol: Lot # [id number]  Expiration: 6/20  Instrument and sponge count is complete x2. Estimated blood loss is 50 cc.   The procedure is well tolerated by the patient who  is taken to recovery room in a well and stable condition.   Specimen: Pelvic washings, Ovarian cyst wall, endometrial polyp and endometrial curettings  sent to pathology

## 2016-09-26 NOTE — Anesthesia Preprocedure Evaluation (Addendum)
Anesthesia Evaluation  Patient identified by MRN, date of birth, ID band Patient awake    Reviewed: Allergy & Precautions, NPO status , Patient's Chart, lab work & pertinent test results  Airway Mallampati: II  TM Distance: >3 FB Neck ROM: Full    Dental  (+) Dental Advisory Given   Pulmonary neg pulmonary ROS,    breath sounds clear to auscultation       Cardiovascular negative cardio ROS   Rhythm:Regular Rate:Normal     Neuro/Psych Depression negative neurological ROS     GI/Hepatic Neg liver ROS, GERD  ,  Endo/Other  Morbid obesity  Renal/GU negative Renal ROS     Musculoskeletal   Abdominal   Peds  Hematology negative hematology ROS (+)   Anesthesia Other Findings   Reproductive/Obstetrics                            Lab Results  Component Value Date   WBC 5.3 09/14/2016   HGB 12.3 09/14/2016   HCT 36.1 09/14/2016   MCV 89.6 09/14/2016   PLT 392 09/14/2016    Anesthesia Physical Anesthesia Plan  ASA: II  Anesthesia Plan: General   Post-op Pain Management:    Induction: Intravenous  Airway Management Planned: Oral ETT  Additional Equipment:   Intra-op Plan:   Post-operative Plan: Extubation in OR  Informed Consent: I have reviewed the patients History and Physical, chart, labs and discussed the procedure including the risks, benefits and alternatives for the proposed anesthesia with the patient or authorized representative who has indicated his/her understanding and acceptance.   Dental advisory given  Plan Discussed with: CRNA  Anesthesia Plan Comments:         Anesthesia Quick Evaluation

## 2016-09-27 ENCOUNTER — Encounter (HOSPITAL_COMMUNITY): Payer: Self-pay | Admitting: Obstetrics and Gynecology

## 2016-10-03 ENCOUNTER — Encounter (HOSPITAL_COMMUNITY): Payer: Self-pay | Admitting: Emergency Medicine

## 2016-10-03 ENCOUNTER — Emergency Department (HOSPITAL_COMMUNITY)
Admission: EM | Admit: 2016-10-03 | Discharge: 2016-10-03 | Disposition: A | Discharge status: Home / Self Care | Payer: Managed Care, Other (non HMO) | Attending: Emergency Medicine | Admitting: Emergency Medicine

## 2016-10-03 ENCOUNTER — Emergency Department (HOSPITAL_COMMUNITY): Payer: Managed Care, Other (non HMO)

## 2016-10-03 DIAGNOSIS — Z5321 Procedure and treatment not carried out due to patient leaving prior to being seen by health care provider: Secondary | ICD-10-CM | POA: Insufficient documentation

## 2016-10-03 DIAGNOSIS — R200 Anesthesia of skin: Secondary | ICD-10-CM | POA: Diagnosis present

## 2016-10-03 NOTE — ED Notes (Addendum)
Pt called from lobby; no response. Registration verbalizes "believes saw pt leave."

## 2016-10-03 NOTE — ED Triage Notes (Addendum)
Pt complaint of continued numbness down left leg since Monday post robotic assisted laparoscopic ovarian cystectomy; had follow up appointment for ongoing numbness down left leg this morning but was late to appointment so was unable to be seen. Pt continues to report left thumb and wrist pain post "pop" trying to get out of bed a few days ago. Pt has strong left pedal and left radial pulse.

## 2016-10-03 NOTE — ED Triage Notes (Signed)
Called pt for room replacement no answer. 

## 2017-08-07 ENCOUNTER — Ambulatory Visit
Admission: RE | Admit: 2017-08-07 | Discharge: 2017-08-07 | Disposition: A | Discharge status: Home / Self Care | Payer: Managed Care, Other (non HMO) | Source: Ambulatory Visit | Attending: Physician Assistant | Admitting: Physician Assistant

## 2017-08-07 ENCOUNTER — Other Ambulatory Visit: Payer: Self-pay | Admitting: Physician Assistant

## 2017-08-07 DIAGNOSIS — K5901 Slow transit constipation: Secondary | ICD-10-CM

## 2017-08-07 DIAGNOSIS — R1084 Generalized abdominal pain: Secondary | ICD-10-CM

## 2017-08-17 ENCOUNTER — Other Ambulatory Visit: Payer: Self-pay | Admitting: Physician Assistant

## 2017-08-17 DIAGNOSIS — R110 Nausea: Secondary | ICD-10-CM

## 2017-08-17 DIAGNOSIS — R1084 Generalized abdominal pain: Secondary | ICD-10-CM

## 2017-08-17 DIAGNOSIS — R1013 Epigastric pain: Secondary | ICD-10-CM

## 2017-08-22 ENCOUNTER — Ambulatory Visit
Admission: RE | Admit: 2017-08-22 | Discharge: 2017-08-22 | Disposition: A | Discharge status: Home / Self Care | Payer: 59 | Source: Ambulatory Visit | Attending: Physician Assistant | Admitting: Physician Assistant

## 2017-08-22 DIAGNOSIS — R1013 Epigastric pain: Secondary | ICD-10-CM

## 2017-08-22 DIAGNOSIS — R110 Nausea: Secondary | ICD-10-CM

## 2017-08-22 DIAGNOSIS — R1084 Generalized abdominal pain: Secondary | ICD-10-CM

## 2017-08-25 ENCOUNTER — Other Ambulatory Visit: Payer: Self-pay | Admitting: Physician Assistant

## 2017-08-25 DIAGNOSIS — R9389 Abnormal findings on diagnostic imaging of other specified body structures: Secondary | ICD-10-CM

## 2017-09-07 ENCOUNTER — Inpatient Hospital Stay
Admission: RE | Admit: 2017-09-07 | Discharge: 2017-09-07 | Disposition: A | Discharge status: Home / Self Care | Payer: 59 | Source: Ambulatory Visit | Attending: Physician Assistant | Admitting: Physician Assistant

## 2017-09-26 ENCOUNTER — Other Ambulatory Visit: Payer: 59

## 2018-04-01 ENCOUNTER — Other Ambulatory Visit: Payer: 59

## 2018-04-08 ENCOUNTER — Ambulatory Visit
Admission: RE | Admit: 2018-04-08 | Discharge: 2018-04-08 | Disposition: A | Discharge status: Home / Self Care | Payer: 59 | Source: Ambulatory Visit | Attending: Physician Assistant | Admitting: Physician Assistant

## 2018-04-08 DIAGNOSIS — R9389 Abnormal findings on diagnostic imaging of other specified body structures: Secondary | ICD-10-CM

## 2018-04-19 ENCOUNTER — Ambulatory Visit
Admission: RE | Admit: 2018-04-19 | Discharge: 2018-04-19 | Disposition: A | Discharge status: Home / Self Care | Payer: 59 | Source: Ambulatory Visit | Attending: Physician Assistant | Admitting: Physician Assistant

## 2018-04-19 MED ORDER — GADOBENATE DIMEGLUMINE 529 MG/ML IV SOLN
15.0000 mL | Freq: Once | INTRAVENOUS | Status: AC | PRN
  Administered 2018-04-19: 15 mL via INTRAVENOUS

## 2018-06-06 ENCOUNTER — Encounter: Payer: Self-pay | Admitting: *Deleted

## 2018-06-08 ENCOUNTER — Ambulatory Visit: Payer: 59 | Admitting: Diagnostic Neuroimaging

## 2018-06-11 ENCOUNTER — Encounter: Payer: Self-pay | Admitting: Diagnostic Neuroimaging

## 2018-06-22 ENCOUNTER — Ambulatory Visit: Payer: 59 | Admitting: Mental Health

## 2018-08-08 ENCOUNTER — Encounter: Payer: Self-pay | Admitting: Emergency Medicine

## 2018-08-08 DIAGNOSIS — F451 Undifferentiated somatoform disorder: Secondary | ICD-10-CM | POA: Insufficient documentation

## 2018-08-08 DIAGNOSIS — F330 Major depressive disorder, recurrent, mild: Secondary | ICD-10-CM | POA: Insufficient documentation

## 2018-08-08 DIAGNOSIS — F4312 Post-traumatic stress disorder, chronic: Secondary | ICD-10-CM | POA: Insufficient documentation

## 2018-08-08 HISTORY — DX: Major depressive disorder, recurrent, mild: F33.0

## 2018-08-08 HISTORY — DX: Post-traumatic stress disorder, chronic: F43.12

## 2018-08-09 ENCOUNTER — Encounter: Payer: Self-pay | Admitting: Neurology

## 2018-08-09 ENCOUNTER — Ambulatory Visit: Payer: 59 | Admitting: Neurology

## 2018-08-09 ENCOUNTER — Other Ambulatory Visit: Payer: Self-pay

## 2018-08-09 VITALS — BP 124/68 | HR 80 | Resp 18 | Ht 70.0 in | Wt 226.0 lb

## 2018-08-09 DIAGNOSIS — M545 Low back pain: Secondary | ICD-10-CM | POA: Diagnosis not present

## 2018-08-09 DIAGNOSIS — G43019 Migraine without aura, intractable, without status migrainosus: Secondary | ICD-10-CM | POA: Diagnosis not present

## 2018-08-09 DIAGNOSIS — G8929 Other chronic pain: Secondary | ICD-10-CM

## 2018-08-09 HISTORY — DX: Migraine without aura, intractable, without status migrainosus: G43.019

## 2018-08-09 MED ORDER — RIZATRIPTAN BENZOATE 10 MG PO TABS: 10.0000 mg | ORAL_TABLET | Freq: Three times a day (TID) | ORAL | 3 refills | Status: DC | PRN

## 2018-08-09 MED ORDER — GABAPENTIN 300 MG PO CAPS: ORAL_CAPSULE | ORAL | 3 refills | Status: DC

## 2018-08-09 NOTE — Patient Instructions (Signed)
We will start Maxalt if needed for the headache.  For the back pain, we will start gabapentin 300 mg capsules.  Neurontin (gabapentin) may result in drowsiness, ankle swelling, gait instability, or possibly dizziness. Please contact our office if significant side effects occur with this medication.

## 2018-08-09 NOTE — Progress Notes (Signed)
Reason for visit: Headache, low back pain  Referring physician: Dr. Richardson Ballard is a 37 y.o. female  History of present illness:  Deanna Ballard is a 37 year old right-handed black female with a history of migraine headaches since she was in her teenage years.  She claims that her sister also has migraine.  The patient may have 3 or 4 headaches a month, the headaches are oftentimes in the retro-orbital area frontal area or occipital areas.  There may be associated with a throbbing pain, the patient may have photophobia, she usually does not have nausea or vomiting.  She denies phonophobia.  She may occasionally have tinnitus with the headache.  The patient also reports some cognitive clouding and some increased heart rate at times when the headache comes on.  She denies any weakness or numbness of the extremities.  She has over the last year had low back pain off and on, she has had persistent back pain since July 2019.  She denies any pain down the leg on either side, she does have midline back pain and pain up the low back.  She has been on Skelaxin and naproxen on a daily basis for the last several months and she has noted that the headaches of lessened in severity and frequency.  She is still having about 2 or 3 headaches a month.  The patient is followed by a chiropractor for her low back, she is not getting benefit with therapy so far.  Initially, she had a fall in 2017 that may have brought on the back pain.  The patient takes Excedrin Migraine at times for the headache.  Otherwise, she does not drink caffeinated products during the day.  She is sent to this office for an evaluation.  Past Medical History:  Diagnosis Date  . Anemia   . Depression   . Dyspnea   . GERD (gastroesophageal reflux disease)   . Headache    Migraines  . High cholesterol   . HTN (hypertension)   . Indigestion   . Irregular heart beat   . Migraine     Past Surgical History:  Procedure Laterality  Date  . HYSTEROSCOPY W/D&C N/A 09/26/2016   Procedure: DILATATION AND CURETTAGE /HYSTEROSCOPY WITH POLYPECTOMY;  Surgeon: Delsa Bern, MD;  Location: [facility];  Service: Gynecology;  Laterality: N/A;  . INTRAUTERINE DEVICE (IUD) INSERTION N/A 09/26/2016   Procedure: INTRAUTERINE DEVICE (IUD) INSERTION;  Surgeon: Delsa Bern, MD;  Location: [facility];  Service: Gynecology;  Laterality: N/A;  . ROBOTIC ASSISTED LAPAROSCOPIC OVARIAN CYSTECTOMY Right 09/26/2016   Procedure: ROBOTIC ASSISTED LAPAROSCOPIC OVARIAN CYSTECTOMY, PELVIC WASHINGS;  Surgeon: Delsa Bern, MD;  Location: [facility];  Service: Gynecology;  Laterality: Right;    Family History  Problem Relation Age of Onset  . Hypertension Mother   . Diabetes Mother   . Healthy Father   . Migraines Sister     Social history:  reports that she has never smoked. She has never used smokeless tobacco. She reports that she drinks alcohol. She reports that she does not use drugs.  Medications:  Prior to Admission medications   Medication Sig Start Date End Date Taking? Authorizing Provider  Adapalene 0.3 % gel Apply 1 application topically daily. 01/13/16  Yes [provider]  buPROPion (WELLBUTRIN XL) 300 MG 24 hr tablet Take 300 mg by mouth every morning. 03/05/18  Yes [provider]  diazepam (VALIUM) 10 MG tablet Take 10 mg by mouth at bedtime.  Yes [provider]  DULoxetine (CYMBALTA) 60 MG capsule Take 60 mg by mouth at bedtime.   Yes [provider]  EPINEPHrine 0.3 mg/0.3 mL IJ SOAJ injection Inject into the muscle as needed. allergic reaction   Yes [provider]  famotidine (PEPCID) 20 MG tablet Take 20 mg by mouth at bedtime.   Yes [provider]  linaclotide (LINZESS) 290 MCG CAPS capsule Take 290 mcg by mouth daily before breakfast.   Yes [provider]  lisdexamfetamine (VYVANSE) 40 MG capsule Take 40 mg by mouth every morning. 05/30/18  Yes [provider]    loratadine (CLARITIN) 10 MG tablet Take 10 mg by mouth daily as needed for allergies.   Yes [provider]  metaxalone (SKELAXIN) 800 MG tablet Take 800 mg by mouth 3 (three) times daily as needed for muscle spasms.   Yes [provider]  pantoprazole (PROTONIX) 40 MG tablet Take 40 mg by mouth 2 (two) times daily.   Yes [provider]  sucralfate (CARAFATE) 1 g tablet Take 1 g by mouth 3 (three) times daily before meals.   Yes [provider]      Allergies  Allergen Reactions  . Amlodipine     Nails hair brittle  . Lisinopril     Cough     ROS:  Out of a complete 14 system review of symptoms, the patient complains only of the following symptoms, and all other reviewed systems are negative.  Fatigue Eye pain Anemia Memory loss, headache  Blood pressure 124/68, pulse 80, resp. rate 18, height 5\' 10"  (1.778 m), weight 226 lb (102.5 kg).  Physical Exam  General: The patient is alert and cooperative at the time of the examination.  Eyes: Pupils are equal, round, and reactive to light. Discs are flat bilaterally.  Neck: The neck is supple, no carotid bruits are noted.  Respiratory: The respiratory examination is clear.  Cardiovascular: The cardiovascular examination reveals a regular rate and rhythm, no obvious murmurs or rubs are noted.   Neuromuscular: The patient appears to have full range of movement of the lumbar spine.  Skin: Extremities are without significant edema.  Neurologic Exam  Mental status: The patient is alert and oriented x 3 at the time of the examination. The patient has apparent normal recent and remote memory, with an apparently normal attention span and concentration ability.  Cranial nerves: Facial symmetry is present. There is good sensation of the face to pinprick and soft touch bilaterally. The strength of the facial muscles and the muscles to head turning and shoulder shrug are normal bilaterally. Speech is  well enunciated, no aphasia or dysarthria is noted. Extraocular movements are full. Visual fields are full. The tongue is midline, and the patient has symmetric elevation of the soft palate. No obvious hearing deficits are noted.  Motor: The motor testing reveals 5 over 5 strength of all 4 extremities. Good symmetric motor tone is noted throughout.  Sensory: Sensory testing is intact to pinprick, soft touch, vibration sensation, and position sense on all 4 extremities. No evidence of extinction is noted.  Coordination: Cerebellar testing reveals good finger-nose-finger and heel-to-shin bilaterally.  Gait and station: Gait is normal. Tandem gait is normal. Romberg is negative. No drift is seen.  Reflexes: Deep tendon reflexes are symmetric and normal bilaterally. Toes are downgoing bilaterally.   Assessment/Plan:  1.  Common migraine headache, intractable  2.  Chronic low back pain  The patient will be given Maxalt to  take if needed for the headache.  We will add gabapentin for the migraine as well as for the low back pain.  She will take 300 mg daily for a week and then go to 300 mg twice daily.  She will follow-up in 4 months, she will call for any dose adjustments of the medication.  We will get an x-ray of the low back, if the pain does not improve with medical therapy, we may consider MRI of the low back in the future.  The patient does not appear to have radicular symptoms at this time.  Jill Alexanders MD 08/09/2018 3:36 PM  [facility] [address] [place], [place] [address]  Phone [phone] Fax [phone]

## 2018-08-15 ENCOUNTER — Other Ambulatory Visit: Payer: Self-pay

## 2018-08-15 ENCOUNTER — Emergency Department (HOSPITAL_COMMUNITY): Payer: 59

## 2018-08-15 ENCOUNTER — Emergency Department (HOSPITAL_COMMUNITY)
Admission: EM | Admit: 2018-08-15 | Discharge: 2018-08-15 | Disposition: A | Discharge status: Home / Self Care | Payer: 59 | Attending: Emergency Medicine | Admitting: Emergency Medicine

## 2018-08-15 ENCOUNTER — Encounter (HOSPITAL_COMMUNITY): Payer: Self-pay | Admitting: *Deleted

## 2018-08-15 DIAGNOSIS — Y929 Unspecified place or not applicable: Secondary | ICD-10-CM | POA: Insufficient documentation

## 2018-08-15 DIAGNOSIS — S8991XA Unspecified injury of right lower leg, initial encounter: Secondary | ICD-10-CM | POA: Diagnosis present

## 2018-08-15 DIAGNOSIS — I10 Essential (primary) hypertension: Secondary | ICD-10-CM | POA: Diagnosis not present

## 2018-08-15 DIAGNOSIS — W1830XA Fall on same level, unspecified, initial encounter: Secondary | ICD-10-CM | POA: Diagnosis not present

## 2018-08-15 DIAGNOSIS — Z79899 Other long term (current) drug therapy: Secondary | ICD-10-CM | POA: Diagnosis not present

## 2018-08-15 DIAGNOSIS — Y999 Unspecified external cause status: Secondary | ICD-10-CM | POA: Insufficient documentation

## 2018-08-15 DIAGNOSIS — S82441A Displaced spiral fracture of shaft of right fibula, initial encounter for closed fracture: Secondary | ICD-10-CM | POA: Insufficient documentation

## 2018-08-15 DIAGNOSIS — Y939 Activity, unspecified: Secondary | ICD-10-CM | POA: Diagnosis not present

## 2018-08-15 MED ORDER — ACETAMINOPHEN 500 MG PO TABS
1000.0000 mg | ORAL_TABLET | Freq: Once | ORAL | Status: AC
  Administered 2018-08-15: 1000 mg via ORAL
  Filled 2018-08-15: qty 2

## 2018-08-15 MED ORDER — ONDANSETRON HCL 4 MG PO TABS: 4.0000 mg | ORAL_TABLET | Freq: Four times a day (QID) | ORAL | 0 refills | Status: DC

## 2018-08-15 MED ORDER — HYDROCODONE-ACETAMINOPHEN 5-325 MG PO TABS: 1.0000 | ORAL_TABLET | Freq: Four times a day (QID) | ORAL | 0 refills | Status: DC | PRN

## 2018-08-15 NOTE — Discharge Instructions (Signed)
You can take Tylenol or Ibuprofen as directed for pain. You can alternate Tylenol and Ibuprofen every 4 hours. If you take Tylenol at 1pm, then you can take Ibuprofen at 5pm. Then you can take Tylenol again at 9pm.   You can take pain medication for severe or breakthrough pain.   Do not get the splint wet. Make sure you are elevating the leg. Use the crutches while walking.   Follow up with [facility]. Call their office to arrange an appointment next week.   Return to the Emergency Department for any worsening pain, discoloration of the foot, numbness, or any other worsening or concerning symptoms.

## 2018-08-15 NOTE — ED Notes (Signed)
Bed: WTR5 Expected date:  Expected time:  Means of arrival:  Comments: 

## 2018-08-15 NOTE — ED Notes (Signed)
Patient transported to X-ray 

## 2018-08-15 NOTE — ED Triage Notes (Addendum)
Pt reports she was on a foot stool reaching for something when she lost her balance, twisting her R ankle while stepping off.  She had 2.5lbs weights around her ankles at the time of the injury.  Injury was last night.  She slept with heating blanket wrapped around her R ankle and took some muscle relaxer (Orphenadrine) and naproxen today.  She is able to bear weight.  Swelling noted, CMS intact.  R pedal pulse 2+ moderate, circ <3 sec.  Pt reports hearing a "crack" when she twisted her R ankle.

## 2018-08-15 NOTE — ED Provider Notes (Signed)
[facility] DEPT Provider Note   CSN: [id number] Arrival date & time: 08/15/18  1117     History   Chief Complaint Chief Complaint  Patient presents with  . Ankle Pain    HPI Deanna Ballard is a 37 y.o. female past medical history of depression, dyspnea, GERD, hypertension who presents for evaluation of right ankle pain that began yesterday after mechanical fall.  Patient reports that she was on a stool reaching for something when she lost her balance causing and inversion twisting of her ankle.  She reports that she had 2.5 pound weights around her ankle at the time of the injury.  She states that she attempted to ambulate and bear weight on the leg but had pain with doing so.  Overnight, she kept the ankle wrapped and applied heat with some improvement in swelling and pain but states that this morning, the pain continued prompting ED visit.  She states she is only really been able to ambulate without ball putting weight on her heel.  Patient denies any numbness/weakness.  She denies any other injury.  The history is provided by the patient.    Past Medical History:  Diagnosis Date  . Anemia   . Common migraine with intractable migraine 08/09/2018  . Depression   . Dyspnea   . GERD (gastroesophageal reflux disease)   . Headache    Migraines  . High cholesterol   . HTN (hypertension)   . Indigestion   . Irregular heart beat   . Migraine     Patient Active Problem List   Diagnosis Date Noted  . Common migraine with intractable migraine 08/09/2018  . Major depressive disorder, recurrent episode, mild with atypical features ([place]) 08/08/2018  . 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 Prolonged posttraumatic stress disorder 08/08/2018    Past Surgical History:  Procedure Laterality Date  . HYSTEROSCOPY W/D&C N/A 09/26/2016   Procedure: DILATATION AND CURETTAGE /HYSTEROSCOPY WITH POLYPECTOMY;  Surgeon: Delsa Bern, MD;  Location: [facility];   Service: Gynecology;  Laterality: N/A;  . INTRAUTERINE DEVICE (IUD) INSERTION N/A 09/26/2016   Procedure: INTRAUTERINE DEVICE (IUD) INSERTION;  Surgeon: Delsa Bern, MD;  Location: [facility];  Service: Gynecology;  Laterality: N/A;  . ROBOTIC ASSISTED LAPAROSCOPIC OVARIAN CYSTECTOMY Right 09/26/2016   Procedure: ROBOTIC ASSISTED LAPAROSCOPIC OVARIAN CYSTECTOMY, PELVIC WASHINGS;  Surgeon: Delsa Bern, MD;  Location: [facility];  Service: Gynecology;  Laterality: Right;     OB History   None      Home Medications    Prior to Admission medications   Medication Sig Start Date End Date Taking? Authorizing Provider  Adapalene 0.3 % gel Apply 1 application topically daily. 01/13/16   [provider]  buPROPion (WELLBUTRIN XL) 300 MG 24 hr tablet Take 300 mg by mouth every morning. 03/05/18   [provider]  diazepam (VALIUM) 10 MG tablet Take 10 mg by mouth at bedtime.    [provider]  DULoxetine (CYMBALTA) 60 MG capsule Take 60 mg by mouth at bedtime.    [provider]  EPINEPHrine 0.3 mg/0.3 mL IJ SOAJ injection Inject into the muscle as needed. allergic reaction    [provider]  famotidine (PEPCID) 20 MG tablet Take 20 mg by mouth at bedtime.    [provider]  gabapentin (NEURONTIN) 300 MG capsule One capsule at night for 1 week, then take one twice a day 08/09/18   Kathrynn Ducking, MD  HYDROcodone-acetaminophen (NORCO/VICODIN) 5-325 MG tablet Take  1-2 tablets by mouth every 6 (six) hours as needed. 08/15/18   Volanda Napoleon, PA-C  linaclotide (LINZESS) 290 MCG CAPS capsule Take 290 mcg by mouth daily before breakfast.    [provider]  lisdexamfetamine (VYVANSE) 40 MG capsule Take 40 mg by mouth every morning. 05/30/18   [provider]  loratadine (CLARITIN) 10 MG tablet Take 10 mg by mouth daily as needed for allergies.    [provider]  metaxalone (SKELAXIN) 800 MG tablet Take 800 mg by mouth 3  (three) times daily as needed for muscle spasms.    [provider]  ondansetron (ZOFRAN) 4 MG tablet Take 1 tablet (4 mg total) by mouth every 6 (six) hours. 08/15/18   Volanda Napoleon, PA-C  pantoprazole (PROTONIX) 40 MG tablet Take 40 mg by mouth 2 (two) times daily.    [provider]  rizatriptan (MAXALT) 10 MG tablet Take 1 tablet (10 mg total) by mouth 3 (three) times daily as needed for migraine. 08/09/18   Kathrynn Ducking, MD  sucralfate (CARAFATE) 1 g tablet Take 1 g by mouth 3 (three) times daily before meals.    [provider]    Family History Family History  Problem Relation Age of Onset  . Hypertension Mother   . Diabetes Mother   . Healthy Father   . Migraines Sister     Social History Social History   Tobacco Use  . Smoking status: Never Smoker  . Smokeless tobacco: Never Used  Substance Use Topics  . Alcohol use: Yes    Comment: socially  . Drug use: No     Allergies   Amlodipine and Lisinopril   Review of Systems Review of Systems  Musculoskeletal:       Right ankle pain  Neurological: Negative for weakness and numbness.  All other systems reviewed and are negative.    Physical Exam Updated Vital Signs BP (!) 138/98 (BP Location: Right Arm)   Pulse (!) 114   Resp 18   LMP 07/29/2018   SpO2 100%   Physical Exam  Constitutional: She appears well-developed and well-nourished.  HENT:  Head: Normocephalic and atraumatic.  Eyes: Conjunctivae and EOM are normal. Right eye exhibits no discharge. Left eye exhibits no discharge. No scleral icterus.  Cardiovascular:  Pulses:      Dorsalis pedis pulses are 2+ on the right side, and 2+ on the left side.  Pulmonary/Chest: Effort normal.  Musculoskeletal:  Tenderness palpation noted to the lateral malleolus and posterior aspect of the right ankle that extends up proximally to the distal fibula.  There is overlying soft tissue swelling.  Soft compartments. No deformity.   No skin tenting.  No tenderness palpation noted to proximal tib-fib or right knee.  Dorsiflexion and plantarflexion intact but with subjective reports of pain.  Patient can wiggle all 5 toes of right foot.  Tenderness palpation noted to left lower extremity.  Neurological: She is alert.  Sensation intact along major nerve distributions of BLE  Skin: Skin is warm and dry. Capillary refill takes less than 2 seconds.  Good distal cap refill. RLE is not dusky in appearance or cool to touch.  Psychiatric: Her speech is normal and behavior is normal. Her mood appears anxious.  Nursing note and vitals reviewed.    ED Treatments / Results  Labs (all labs ordered are listed, but only abnormal results are displayed) Labs Reviewed - No data to display  EKG None  Radiology Dg Ankle  Complete Right  Result Date: 08/15/2018 CLINICAL DATA:  Pain and swelling after a twisting injury yesterday. EXAM: RIGHT ANKLE - COMPLETE 3+ VIEW COMPARISON:  None. FINDINGS: There is a slightly displaced spiral fracture of the distal fibula. No dislocation. Distal tibia is intact. Talus and calcaneus appear normal. Soft tissue swelling over the lateral malleolus. IMPRESSION: Minimally displaced spiral fracture of the distal right fibula. Electronically Signed   By: Lorriane Shire M.D.   On: 08/15/2018 12:18    Procedures Procedures (including critical care time)  Medications Ordered in ED Medications  acetaminophen (TYLENOL) tablet 1,000 mg (1,000 mg Oral Given 08/15/18 1255)     Initial Impression / Assessment and Plan / ED Course  I have reviewed the triage vital signs and the nursing notes.  Pertinent labs & imaging results that were available during my care of the patient were reviewed by me and considered in my medical decision making (see chart for details).     37 year old female who presents for evaluation of right ankle pain after mechanical fall that occurred yesterday. Patient is afebrile,  non-toxic appearing, sitting comfortably on examination table. Vital signs reviewed and stable.  Patient is slightly tachycardic.  She is also very anxious on my exam.  I suspect this is likely contributing to tachycardia.  Patient is neurovascularly intact.  On exam, tenderness palpation the right malleolus and posterior aspect of the right ankle.  Consider fracture versus dislocation versus sprain.  X-rays ordered at triage.  X-ray shows a minimally displaced spiral fracture of the distal right fibula.  Patient reviewed on with currently PMP.  No recent narcotic prescriptions.  Discussed patient with Dr. Lucia Gaskins ([facility]). Agrees with plan. No further imaging needed. Will follow up in outpatient office next week.   Reevaluation after splint placement.  Patient with good distal cap refill and sensation.  Instructed patient on splint and supportive care measures.  Instructed patient to follow-up with referred orthopedic doctor for further evaluation.  Repeat vitals show patient is still tachycardic.  She does endorse being very anxious.  Suspect this is likely cause of her tachycardia.  Patient had ample opportunity for questions and discussion. All patient's questions were answered with full understanding. Strict return precautions discussed. Patient expresses understanding and agreement to plan.    Final Clinical Impressions(s) / ED Diagnoses   Final diagnoses:  Closed displaced spiral fracture of shaft of right fibula, initial encounter    ED Discharge Orders         Ordered    HYDROcodone-acetaminophen (NORCO/VICODIN) 5-325 MG tablet  Every 6 hours PRN     08/15/18 1308    ondansetron (ZOFRAN) 4 MG tablet  Every 6 hours     08/15/18 1405           Volanda Napoleon, PA-C 08/15/18 1458    Charlesetta Shanks, MD 08/16/18 1233

## 2018-08-20 ENCOUNTER — Other Ambulatory Visit: Payer: Self-pay | Admitting: Orthopaedic Surgery

## 2018-08-21 NOTE — Pre-Procedure Instructions (Signed)
KASHAWNA MANZER  08/21/2018      [id number] [place], [place] - [address] [place] [address] Phone: [phone] Fax: [phone]  [id number] - [place], [address] [address] Phone: [phone] Fax: [phone]    Your procedure is scheduled on December 9th.  Report to [facility] Admitting at 5:30 A.M.  Call this number if you have problems the morning of surgery:  [phone]   Remember:  Do not eat or drink after midnight.     Take these medicines the morning of surgery with A SIP OF WATER   Bupropion (Wellbutrin)  Famotidine (Pepcid) - if needed  Gabapentin (Neurontin) - if needed  Metaxalone (Skelaxin) - if needed  Orphenadrine (Norflex) - if needed  Pantoprazole (Protonix) - if needed  Sucralfate (carafate) - if needed   7 days prior to surgery STOP taking any Aspirin(unless otherwise instructed by your surgeon), Aleve, Naproxen, Ibuprofen, Motrin, Advil, Goody's, BC's, all herbal medications, fish oil, and all vitamins     Do not wear jewelry, make-up or nail polish.  Do not wear lotions, powders, or perfumes, or deodorant.  Do not shave 48 hours prior to surgery.  Men may shave face and neck.  Do not bring valuables to the hospital.  [facility] is not responsible for any belongings or valuables.   [facility]- Preparing For Surgery  Before surgery, you can play an important role. Because skin is not sterile, your skin needs to be as free of germs as possible. You can reduce the number of germs on your skin by washing with CHG (chlorahexidine gluconate) Soap before surgery.  CHG is an antiseptic cleaner which kills germs and bonds with the skin to continue killing germs even after washing.    Oral Hygiene is also important to reduce your risk of infection.  Remember - BRUSH YOUR TEETH THE MORNING OF SURGERY WITH YOUR REGULAR TOOTHPASTE  Please do not use if  you have an allergy to CHG or antibacterial soaps. If your skin becomes reddened/irritated stop using the CHG.  Do not shave (including legs and underarms) for at least 48 hours prior to first CHG shower. It is OK to shave your face.  Please follow these instructions carefully.   1. Shower the NIGHT BEFORE SURGERY and the MORNING OF SURGERY with CHG.   2. If you chose to wash your hair, wash your hair first as usual with your normal shampoo.  3. After you shampoo, rinse your hair and body thoroughly to remove the shampoo.  4. Use CHG as you would any other liquid soap. You can apply CHG directly to the skin and wash gently with a scrungie or a clean washcloth.   5. Apply the CHG Soap to your body ONLY FROM THE NECK DOWN.  Do not use on open wounds or open sores. Avoid contact with your eyes, ears, mouth and genitals (private parts). Wash Face and genitals (private parts)  with your normal soap.  6. Wash thoroughly, paying special attention to the area where your surgery will be performed.  7. Thoroughly rinse your body with warm water from the neck down.  8. DO NOT shower/wash with your normal soap after using and rinsing off the CHG Soap.  9. Pat yourself dry with a CLEAN TOWEL.  10. Wear CLEAN PAJAMAS to bed the night before surgery, wear comfortable clothes the morning of surgery  11. Place CLEAN  SHEETS on your bed the night of your first shower and DO NOT SLEEP WITH PETS.   Day of Surgery:  Do not apply any deodorants/lotions.  Please wear clean clothes to the hospital/surgery center.   Remember to brush your teeth WITH YOUR REGULAR TOOTHPASTE.   Contacts, dentures or bridgework may not be worn into surgery.  Leave your suitcase in the car.  After surgery it may be brought to your room.  For patients admitted to the hospital, discharge time will be determined by your treatment team.  Patients discharged the day of surgery will not be allowed to drive home.    Please read  over the following fact sheets that you were given. Coughing and Deep Breathing, MRSA Information and Surgical Site Infection Prevention

## 2018-08-22 ENCOUNTER — Inpatient Hospital Stay (HOSPITAL_COMMUNITY)
Admission: RE | Admit: 2018-08-22 | Discharge: 2018-08-22 | Disposition: A | Discharge status: Home / Self Care | Payer: 59 | Source: Ambulatory Visit

## 2018-08-23 ENCOUNTER — Ambulatory Visit: Payer: Self-pay | Admitting: Psychiatry

## 2018-08-24 ENCOUNTER — Encounter (HOSPITAL_COMMUNITY): Payer: Self-pay | Admitting: *Deleted

## 2018-08-24 ENCOUNTER — Other Ambulatory Visit: Payer: Self-pay

## 2018-08-24 NOTE — Progress Notes (Signed)
Spoke with pt for pre-op call. Pt denies cardiac history. States a few years ago during a stressful situation her blood pressure was elevated and she was given a Rx but states she never took it and she states her doctor says her bp is usually fine. Pt states she is not diabetic. At this time, pt does not have anyone that can stay with her for the 24 hours after surgery. I instructed her to call Dr. Pollie Friar office and let them know that so they can make arrangements for her to stay overnight.

## 2018-08-26 NOTE — Anesthesia Preprocedure Evaluation (Addendum)
Anesthesia Evaluation  Patient identified by MRN, date of birth, ID band Patient awake    Reviewed: Allergy & Precautions, NPO status , Patient's Chart, lab work & pertinent test results  Airway Mallampati: II  TM Distance: >3 FB Neck ROM: Full    Dental  (+) Dental Advisory Given   Pulmonary neg pulmonary ROS,    breath sounds clear to auscultation       Cardiovascular hypertension,  Rhythm:Regular Rate:Normal     Neuro/Psych  Headaches,    GI/Hepatic Neg liver ROS, GERD  ,  Endo/Other  negative endocrine ROS  Renal/GU negative Renal ROS     Musculoskeletal   Abdominal   Peds  Hematology negative hematology ROS (+)   Anesthesia Other Findings   Reproductive/Obstetrics                            Lab Results  Component Value Date   WBC 5.3 09/14/2016   HGB 12.3 09/14/2016   HCT 36.1 09/14/2016   MCV 89.6 09/14/2016   PLT 392 09/14/2016   Lab Results  Component Value Date   CREATININE 0.84 02/07/2016   BUN 9 02/07/2016   NA 133 (L) 02/07/2016   K 3.7 02/07/2016   CL 102 02/07/2016   CO2 24 02/07/2016    Anesthesia Physical Anesthesia Plan  ASA: II  Anesthesia Plan: General   Post-op Pain Management:  Regional for Post-op pain   Induction: Intravenous  PONV Risk Score and Plan: 3 and Midazolam, Dexamethasone, Ondansetron and Treatment may vary due to age or medical condition  Airway Management Planned: LMA and Oral ETT  Additional Equipment:   Intra-op Plan:   Post-operative Plan: Extubation in OR  Informed Consent: I have reviewed the patients History and Physical, chart, labs and discussed the procedure including the risks, benefits and alternatives for the proposed anesthesia with the patient or authorized representative who has indicated his/her understanding and acceptance.     Plan Discussed with:   Anesthesia Plan Comments:        Anesthesia Quick  Evaluation

## 2018-08-27 ENCOUNTER — Encounter (HOSPITAL_COMMUNITY)
Admission: RE | Disposition: A | Discharge status: Home / Self Care | Payer: Self-pay | Source: Home / Self Care | Attending: Orthopaedic Surgery

## 2018-08-27 ENCOUNTER — Ambulatory Visit (HOSPITAL_COMMUNITY): Payer: 59

## 2018-08-27 ENCOUNTER — Encounter (HOSPITAL_COMMUNITY): Payer: Self-pay | Admitting: *Deleted

## 2018-08-27 ENCOUNTER — Ambulatory Visit (HOSPITAL_COMMUNITY): Payer: 59 | Admitting: Anesthesiology

## 2018-08-27 ENCOUNTER — Other Ambulatory Visit: Payer: Self-pay

## 2018-08-27 ENCOUNTER — Ambulatory Visit (HOSPITAL_COMMUNITY)
Admission: RE | Admit: 2018-08-27 | Discharge: 2018-08-27 | Disposition: A | Discharge status: Home / Self Care | Payer: 59 | Attending: Orthopaedic Surgery | Admitting: Orthopaedic Surgery

## 2018-08-27 DIAGNOSIS — K219 Gastro-esophageal reflux disease without esophagitis: Secondary | ICD-10-CM | POA: Diagnosis not present

## 2018-08-27 DIAGNOSIS — F329 Major depressive disorder, single episode, unspecified: Secondary | ICD-10-CM | POA: Insufficient documentation

## 2018-08-27 DIAGNOSIS — S82899A Other fracture of unspecified lower leg, initial encounter for closed fracture: Secondary | ICD-10-CM | POA: Diagnosis present

## 2018-08-27 DIAGNOSIS — Z79899 Other long term (current) drug therapy: Secondary | ICD-10-CM | POA: Diagnosis not present

## 2018-08-27 DIAGNOSIS — E7800 Pure hypercholesterolemia, unspecified: Secondary | ICD-10-CM | POA: Diagnosis not present

## 2018-08-27 DIAGNOSIS — G43019 Migraine without aura, intractable, without status migrainosus: Secondary | ICD-10-CM | POA: Insufficient documentation

## 2018-08-27 DIAGNOSIS — Z888 Allergy status to other drugs, medicaments and biological substances status: Secondary | ICD-10-CM | POA: Insufficient documentation

## 2018-08-27 DIAGNOSIS — M25561 Pain in right knee: Secondary | ICD-10-CM | POA: Diagnosis not present

## 2018-08-27 DIAGNOSIS — M24071 Loose body in right ankle: Secondary | ICD-10-CM | POA: Diagnosis not present

## 2018-08-27 DIAGNOSIS — I10 Essential (primary) hypertension: Secondary | ICD-10-CM | POA: Insufficient documentation

## 2018-08-27 DIAGNOSIS — Z791 Long term (current) use of non-steroidal anti-inflammatories (NSAID): Secondary | ICD-10-CM | POA: Diagnosis not present

## 2018-08-27 DIAGNOSIS — S8261XA Displaced fracture of lateral malleolus of right fibula, initial encounter for closed fracture: Secondary | ICD-10-CM | POA: Diagnosis present

## 2018-08-27 DIAGNOSIS — Z419 Encounter for procedure for purposes other than remedying health state, unspecified: Secondary | ICD-10-CM

## 2018-08-27 HISTORY — PX: ORIF ANKLE FRACTURE: SHX5408

## 2018-08-27 HISTORY — DX: Constipation, unspecified: K59.00

## 2018-08-27 HISTORY — DX: Dorsalgia, unspecified: M54.9

## 2018-08-27 HISTORY — DX: Other chronic pain: G89.29

## 2018-08-27 HISTORY — DX: Anxiety disorder, unspecified: F41.9

## 2018-08-27 LAB — CBC
HCT: 36.8 % (ref 36.0–46.0)
HEMOGLOBIN: 11.6 g/dL — AB (ref 12.0–15.0)
MCH: 30.1 pg (ref 26.0–34.0)
MCHC: 31.5 g/dL (ref 30.0–36.0)
MCV: 95.6 fL (ref 80.0–100.0)
Platelets: 395 10*3/uL (ref 150–400)
RBC: 3.85 MIL/uL — ABNORMAL LOW (ref 3.87–5.11)
RDW: 14.8 % (ref 11.5–15.5)
WBC: 3.6 10*3/uL — ABNORMAL LOW (ref 4.0–10.5)
nRBC: 0 % (ref 0.0–0.2)

## 2018-08-27 LAB — POCT PREGNANCY, URINE: Preg Test, Ur: NEGATIVE

## 2018-08-27 SURGERY — OPEN REDUCTION INTERNAL FIXATION (ORIF) ANKLE FRACTURE
Anesthesia: General | Site: Ankle | Laterality: Right

## 2018-08-27 MED ORDER — LIDOCAINE 2% (20 MG/ML) 5 ML SYRINGE
INTRAMUSCULAR | Status: AC
  Filled 2018-08-27: qty 5

## 2018-08-27 MED ORDER — DEXAMETHASONE SODIUM PHOSPHATE 10 MG/ML IJ SOLN
INTRAMUSCULAR | Status: AC
  Filled 2018-08-27: qty 1

## 2018-08-27 MED ORDER — PROMETHAZINE HCL 25 MG/ML IJ SOLN: 6.2500 mg | INTRAMUSCULAR | Status: DC | PRN

## 2018-08-27 MED ORDER — METOCLOPRAMIDE HCL 5 MG PO TABS: 5.0000 mg | ORAL_TABLET | Freq: Three times a day (TID) | ORAL | Status: DC | PRN

## 2018-08-27 MED ORDER — ROPIVACAINE HCL 5 MG/ML IJ SOLN
INTRAMUSCULAR | Status: DC | PRN
  Administered 2018-08-27: 20 mL via PERINEURAL

## 2018-08-27 MED ORDER — CEFAZOLIN SODIUM-DEXTROSE 2-4 GM/100ML-% IV SOLN
2.0000 g | INTRAVENOUS | Status: AC
  Administered 2018-08-27: 2 g via INTRAVENOUS
  Filled 2018-08-27: qty 100

## 2018-08-27 MED ORDER — SODIUM CHLORIDE 0.9 % IR SOLN
Status: DC | PRN
  Administered 2018-08-27: 1

## 2018-08-27 MED ORDER — OXYCODONE HCL 5 MG PO TABS
5.0000 mg | ORAL_TABLET | ORAL | Status: DC | PRN
  Administered 2018-08-27: 5 mg via ORAL
  Filled 2018-08-27: qty 1

## 2018-08-27 MED ORDER — FENTANYL CITRATE (PF) 250 MCG/5ML IJ SOLN
INTRAMUSCULAR | Status: AC
  Filled 2018-08-27: qty 5

## 2018-08-27 MED ORDER — ONDANSETRON HCL 4 MG/2ML IJ SOLN
INTRAMUSCULAR | Status: AC
  Filled 2018-08-27: qty 2

## 2018-08-27 MED ORDER — ONDANSETRON HCL 4 MG/2ML IJ SOLN: 4.0000 mg | Freq: Four times a day (QID) | INTRAMUSCULAR | Status: DC | PRN

## 2018-08-27 MED ORDER — PROPOFOL 10 MG/ML IV BOLUS
INTRAVENOUS | Status: DC | PRN
  Administered 2018-08-27: 200 mg via INTRAVENOUS

## 2018-08-27 MED ORDER — MIDAZOLAM HCL 2 MG/2ML IJ SOLN
INTRAMUSCULAR | Status: AC
  Filled 2018-08-27: qty 2

## 2018-08-27 MED ORDER — PROPOFOL 10 MG/ML IV BOLUS
INTRAVENOUS | Status: AC
  Filled 2018-08-27: qty 20

## 2018-08-27 MED ORDER — PHENYLEPHRINE 40 MCG/ML (10ML) SYRINGE FOR IV PUSH (FOR BLOOD PRESSURE SUPPORT)
PREFILLED_SYRINGE | INTRAVENOUS | Status: DC | PRN
  Administered 2018-08-27 (×3): 80 ug via INTRAVENOUS

## 2018-08-27 MED ORDER — LIDOCAINE 2% (20 MG/ML) 5 ML SYRINGE
INTRAMUSCULAR | Status: DC | PRN
  Administered 2018-08-27: 20 mg via INTRAVENOUS

## 2018-08-27 MED ORDER — BUPIVACAINE HCL (PF) 0.5 % IJ SOLN
INTRAMUSCULAR | Status: DC | PRN
  Administered 2018-08-27: 25 mL

## 2018-08-27 MED ORDER — PHENYLEPHRINE 40 MCG/ML (10ML) SYRINGE FOR IV PUSH (FOR BLOOD PRESSURE SUPPORT)
PREFILLED_SYRINGE | INTRAVENOUS | Status: AC
  Filled 2018-08-27: qty 10

## 2018-08-27 MED ORDER — ROCURONIUM BROMIDE 50 MG/5ML IV SOSY
PREFILLED_SYRINGE | INTRAVENOUS | Status: AC
  Filled 2018-08-27: qty 5

## 2018-08-27 MED ORDER — METOCLOPRAMIDE HCL 5 MG/ML IJ SOLN: 5.0000 mg | Freq: Three times a day (TID) | INTRAMUSCULAR | Status: DC | PRN

## 2018-08-27 MED ORDER — ONDANSETRON HCL 4 MG PO TABS: 4.0000 mg | ORAL_TABLET | Freq: Four times a day (QID) | ORAL | Status: DC | PRN

## 2018-08-27 MED ORDER — ONDANSETRON HCL 4 MG/2ML IJ SOLN
INTRAMUSCULAR | Status: DC | PRN
  Administered 2018-08-27: 4 mg via INTRAVENOUS

## 2018-08-27 MED ORDER — MIDAZOLAM HCL 5 MG/5ML IJ SOLN
INTRAMUSCULAR | Status: DC | PRN
  Administered 2018-08-27: 2 mg via INTRAVENOUS

## 2018-08-27 MED ORDER — FENTANYL CITRATE (PF) 100 MCG/2ML IJ SOLN: 25.0000 ug | INTRAMUSCULAR | Status: DC | PRN

## 2018-08-27 MED ORDER — LACTATED RINGERS IV SOLN
INTRAVENOUS | Status: DC | PRN
  Administered 2018-08-27: 07:00:00 via INTRAVENOUS

## 2018-08-27 MED ORDER — FENTANYL CITRATE (PF) 100 MCG/2ML IJ SOLN
INTRAMUSCULAR | Status: DC | PRN
  Administered 2018-08-27: 25 ug via INTRAVENOUS
  Administered 2018-08-27 (×2): 50 ug via INTRAVENOUS

## 2018-08-27 MED ORDER — DEXAMETHASONE SODIUM PHOSPHATE 10 MG/ML IJ SOLN
INTRAMUSCULAR | Status: DC | PRN
  Administered 2018-08-27: 10 mg via INTRAVENOUS

## 2018-08-27 SURGICAL SUPPLY — 61 items
ALCOHOL 70% 16 OZ (MISCELLANEOUS) ×3 IMPLANT
BANDAGE ELASTIC 6 VELCRO ST LF (GAUZE/BANDAGES/DRESSINGS) ×3 IMPLANT
BANDAGE ESMARK 6X9 LF (GAUZE/BANDAGES/DRESSINGS) ×1 IMPLANT
BIT DRILL 2.5X110 QC LCP DISP (BIT) ×3 IMPLANT
BLADE SURG 15 STRL LF DISP TIS (BLADE) ×1 IMPLANT
BLADE SURG 15 STRL SS (BLADE) ×2
BNDG COHESIVE 4X5 TAN STRL (GAUZE/BANDAGES/DRESSINGS) ×3 IMPLANT
BNDG COHESIVE 6X5 TAN STRL LF (GAUZE/BANDAGES/DRESSINGS) ×3 IMPLANT
BNDG ELASTIC 6X10 VLCR STRL LF (GAUZE/BANDAGES/DRESSINGS) ×6 IMPLANT
BNDG ESMARK 6X9 LF (GAUZE/BANDAGES/DRESSINGS) ×3
CANISTER SUCT 3000ML PPV (MISCELLANEOUS) ×3 IMPLANT
CHLORAPREP W/TINT 26ML (MISCELLANEOUS) ×6 IMPLANT
COVER SURGICAL LIGHT HANDLE (MISCELLANEOUS) ×3 IMPLANT
COVER WAND RF STERILE (DRAPES) ×3 IMPLANT
CUFF TOURNIQUET SINGLE 34IN LL (TOURNIQUET CUFF) ×3 IMPLANT
CUFF TOURNIQUET SINGLE 44IN (TOURNIQUET CUFF) IMPLANT
DRAPE OEC MINIVIEW 54X84 (DRAPES) ×3 IMPLANT
DRAPE U-SHAPE 47X51 STRL (DRAPES) ×3 IMPLANT
DRSG MEPITEL 4X7.2 (GAUZE/BANDAGES/DRESSINGS) ×3 IMPLANT
DRSG PAD ABDOMINAL 8X10 ST (GAUZE/BANDAGES/DRESSINGS) ×6 IMPLANT
ELECT REM PT RETURN 9FT ADLT (ELECTROSURGICAL) ×3
ELECTRODE REM PT RTRN 9FT ADLT (ELECTROSURGICAL) ×1 IMPLANT
GAUZE SPONGE 4X4 12PLY STRL (GAUZE/BANDAGES/DRESSINGS) ×3 IMPLANT
GAUZE XEROFORM 1X8 LF (GAUZE/BANDAGES/DRESSINGS) ×3 IMPLANT
GLOVE BIO SURGEON STRL SZ7.5 (GLOVE) ×3 IMPLANT
GLOVE BIOGEL PI IND STRL 8 (GLOVE) ×1 IMPLANT
GLOVE BIOGEL PI INDICATOR 8 (GLOVE) ×2
GLOVE ECLIPSE 8.0 STRL XLNG CF (GLOVE) ×3 IMPLANT
GOWN STRL REUS W/ TWL LRG LVL3 (GOWN DISPOSABLE) ×1 IMPLANT
GOWN STRL REUS W/ TWL XL LVL3 (GOWN DISPOSABLE) ×2 IMPLANT
GOWN STRL REUS W/TWL LRG LVL3 (GOWN DISPOSABLE) ×2
GOWN STRL REUS W/TWL XL LVL3 (GOWN DISPOSABLE) ×4
KIT BASIN OR (CUSTOM PROCEDURE TRAY) ×3 IMPLANT
KIT TURNOVER KIT B (KITS) ×3 IMPLANT
NS IRRIG 1000ML POUR BTL (IV SOLUTION) ×3 IMPLANT
PACK ORTHO EXTREMITY (CUSTOM PROCEDURE TRAY) ×3 IMPLANT
PAD ARMBOARD 7.5X6 YLW CONV (MISCELLANEOUS) ×6 IMPLANT
PAD CAST 4YDX4 CTTN HI CHSV (CAST SUPPLIES) ×1 IMPLANT
PADDING CAST COTTON 4X4 STRL (CAST SUPPLIES) ×2
PADDING CAST SYNTHETIC 4 (CAST SUPPLIES) ×2
PADDING CAST SYNTHETIC 4X4 STR (CAST SUPPLIES) ×1 IMPLANT
PLATE LCP 3.5 1/3 TUB 7HX81 (Plate) ×3 IMPLANT
SCREW CANC FT 12 4.0 (Screw) ×3 IMPLANT
SCREW CORTEX 3.5 14MM (Screw) ×4 IMPLANT
SCREW CORTEX 3.5 16MM (Screw) ×4 IMPLANT
SCREW CORTEX 3.5 20MM (Screw) ×2 IMPLANT
SCREW LOCK CORT ST 3.5X14 (Screw) ×2 IMPLANT
SCREW LOCK CORT ST 3.5X16 (Screw) ×2 IMPLANT
SCREW LOCK CORT ST 3.5X20 (Screw) ×1 IMPLANT
SPLINT FIBERGLASS 4X30 (CAST SUPPLIES) ×6 IMPLANT
SPONGE LAP 18X18 X RAY DECT (DISPOSABLE) ×3 IMPLANT
SUCTION FRAZIER HANDLE 10FR (MISCELLANEOUS) ×2
SUCTION TUBE FRAZIER 10FR DISP (MISCELLANEOUS) ×1 IMPLANT
SUT ETHILON 3 0 PS 1 (SUTURE) ×3 IMPLANT
SUT MNCRL AB 3-0 PS2 18 (SUTURE) IMPLANT
SUT VIC AB 2-0 CT1 27 (SUTURE) ×4
SUT VIC AB 2-0 CT1 TAPERPNT 27 (SUTURE) ×2 IMPLANT
TOWEL OR 17X24 6PK STRL BLUE (TOWEL DISPOSABLE) ×3 IMPLANT
TOWEL OR 17X26 10 PK STRL BLUE (TOWEL DISPOSABLE) ×3 IMPLANT
TUBE CONNECTING 12'X1/4 (SUCTIONS) ×1
TUBE CONNECTING 12X1/4 (SUCTIONS) ×2 IMPLANT

## 2018-08-27 NOTE — Transfer of Care (Signed)
Immediate Anesthesia Transfer of Care Note  Patient: Deanna Ballard  Procedure(s) Performed: OPEN REDUCTION INTERNAL FIXATION (ORIF) ANKLE FRACTURE (Right Ankle)  Patient Location: PACU  Anesthesia Type:GA combined with regional for post-op pain  Level of Consciousness: awake, alert  and oriented  Airway & Oxygen Therapy: Patient Spontanous Breathing and Patient connected to face mask oxygen  Post-op Assessment: Report given to RN and Post -op Vital signs reviewed and stable  Post vital signs: Reviewed and stable  Last Vitals:  Vitals Value Taken Time  BP 117/78 08/27/2018  8:45 AM  Temp    Pulse 108   Resp 12 08/27/2018  8:45 AM  SpO2 100%   Vitals shown include unvalidated device data.  Last Pain:  Vitals:   08/27/18 0700  PainSc: 1       Patients Stated Pain Goal: 2 (04/59/97 7414)  Complications: No apparent anesthesia complications

## 2018-08-27 NOTE — Anesthesia Procedure Notes (Signed)
Anesthesia Regional Block: Adductor canal block   Pre-Anesthetic Checklist: ,, timeout performed, Correct Patient, Correct Site, Correct Laterality, Correct Procedure, Correct Position, site marked, Risks and benefits discussed,  Surgical consent,  Pre-op evaluation,  At surgeon's request and post-op pain management  Laterality: Right  Prep: chloraprep       Needles:  Injection technique: Single-shot  Needle Type: Echogenic Needle     Needle Length: 9cm  Needle Gauge: 21     Additional Needles:   Procedures:,,,, ultrasound used (permanent image in chart),,,,  Narrative:  Start time: 08/27/2018 7:16 AM End time: 08/27/2018 7:20 AM Injection made incrementally with aspirations every 5 mL.  Performed by: Personally  Anesthesiologist: Suzette Battiest, MD

## 2018-08-27 NOTE — Progress Notes (Signed)
Pt. Waiting for discharge. Contacted Dr. Lucia Gaskins in regards to reconciliation needing to be resolved for pt. To be discharged. Stated he will resolve the issue.
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
## 2018-08-27 NOTE — Anesthesia Procedure Notes (Signed)
Procedure Name: LMA Insertion Date/Time: 08/27/2018 7:35 AM Performed by: Genelle Bal, CRNA Pre-anesthesia Checklist: Patient identified, Emergency Drugs available, Suction available and Patient being monitored Patient Re-evaluated:Patient Re-evaluated prior to induction Oxygen Delivery Method: Circle system utilized Preoxygenation: Pre-oxygenation with 100% oxygen Induction Type: IV induction Ventilation: Mask ventilation without difficulty LMA: LMA inserted LMA Size: 4.0 Number of attempts: 1 Airway Equipment and Method: Bite block Placement Confirmation: positive ETCO2 Tube secured with: Tape Dental Injury: Teeth and Oropharynx as per pre-operative assessment

## 2018-08-27 NOTE — Anesthesia Procedure Notes (Signed)
Anesthesia Regional Block: Popliteal block   Pre-Anesthetic Checklist: ,, timeout performed, Correct Patient, Correct Site, Correct Laterality, Correct Procedure, Correct Position, site marked, Risks and benefits discussed,  Surgical consent,  Pre-op evaluation,  At surgeon's request and post-op pain management  Laterality: Right  Prep: chloraprep       Needles:  Injection technique: Single-shot  Needle Type: Echogenic Needle     Needle Length: 9cm  Needle Gauge: 21     Additional Needles:   Procedures:,,,, ultrasound used (permanent image in chart),,,,   Nerve Stimulator or Paresthesia:  Response: plantar flexion, 0.6 mA,   Additional Responses:   Narrative:  Start time: 08/27/2018 7:10 AM End time: 08/27/2018 7:16 AM Injection made incrementally with aspirations every 5 mL.  Performed by: Personally  Anesthesiologist: Suzette Battiest, MD

## 2018-08-27 NOTE — Discharge Instructions (Signed)
DR. Lucia Gaskins FOOT [facility] ANKLE SURGERY POST-OP INSTRUCTIONS   Pain Management 1. The numbing medicine and your leg will last around 18 hours, take a dose of your pain medicine as soon as you feel it wearing off to avoid rebound pain. 2. Keep your foot elevated above heart level.  Make sure that your heel hangs free ('floats'). 3. Take all prescribed medication as directed. 4. If taking narcotic pain medication you may want to use an over-the-counter stool softener to avoid constipation. 5. You may take over-the-counter NSAIDs (ibuprofen, naproxen, etc.) as well as over-the-counter acetaminophen as directed on the packaging as a supplement for your pain and may also use it to wean away from the prescription medication. 6. For DVT prophylaxis take one 325mg  aspirin daily as prescribed.  Activity ?  ? Non-weightbearing ?   First Postoperative Visit 1. Your first postop visit will be at least 2 weeks after surgery.  This should be scheduled when you schedule surgery. 2. If you do not have a postoperative visit scheduled please call [phone] to schedule an appointment. 3. At the appointment your incision will be evaluated for suture removal, x-rays will be obtained if necessary.  General Instructions 1. Swelling is very common after foot and ankle surgery.  It often takes 3 months for the foot and ankle to begin to feel comfortable.  Some amount of swelling will persist for 6-12 months. 2. DO NOT change the dressing.  If there is a problem with the dressing (too tight, loose, gets wet, etc.) please contact Dr. Pollie Friar office. 3. DO NOT get the dressing wet.  For showers you can use an over-the-counter cast cover or wrap a washcloth around the top of your dressing and then cover it with a plastic bag and tape it to your leg. 4. DO NOT soak the incision (no tubs, pools, bath, etc.) until you have approval from Dr. Lucia Gaskins.  Contact Dr. Huel Cote office or go to Emergency Room if: 1. Temperature above  101 F. 2. Increasing pain that is unresponsive to pain medication or elevation 3. Excessive redness or swelling in your foot 4. Dressing problems - excessive bloody drainage, looseness or tightness, or if dressing gets wet 5. Develop pain, swelling, warmth, or discoloration of your calf

## 2018-08-27 NOTE — Progress Notes (Signed)
Called Dr. Pollie Friar office; Spoke with Mendel Ryder she stated Dr. Pollie Friar Medical Assistant Ebony Hail would be contacting the patient to speak with pt. Regarding her discharge.

## 2018-08-27 NOTE — Progress Notes (Signed)
I spoke with patient about having a ride after surgery home as well as having someone stay with her for 24 hours after surgery.  Patient called her husband for the ride but he was unavailable and her neighbor who could stay with her was sleeping after a night shift.  At this time the patient does not have a ride home or someone that can stay with her.  Will speak with Dr. Lucia Gaskins, I also asked the patient to make him aware as well.

## 2018-08-27 NOTE — Anesthesia Postprocedure Evaluation (Signed)
Anesthesia Post Note  Patient: Deanna Ballard  Procedure(s) Performed: OPEN REDUCTION INTERNAL FIXATION (ORIF) ANKLE FRACTURE (Right Ankle)     Patient location during evaluation: PACU Anesthesia Type: General Level of consciousness: awake and alert Pain management: pain level controlled Vital Signs Assessment: post-procedure vital signs reviewed and stable Respiratory status: spontaneous breathing, nonlabored ventilation, respiratory function stable and patient connected to nasal cannula oxygen Cardiovascular status: blood pressure returned to baseline and stable Postop Assessment: no apparent nausea or vomiting Anesthetic complications: no    Last Vitals:  Vitals:   08/27/18 0930 08/27/18 1049  BP: 119/79 103/77  Pulse: 89   Resp: 18   Temp: 36.4 C (!) 36.4 C  SpO2: 100% 100%    Last Pain:  Vitals:   08/27/18 1049  TempSrc: Oral  PainSc: 0-No pain                 Tiajuana Amass

## 2018-08-29 ENCOUNTER — Encounter (HOSPITAL_COMMUNITY): Payer: Self-pay | Admitting: Orthopaedic Surgery

## 2018-09-11 ENCOUNTER — Other Ambulatory Visit: Payer: Self-pay | Admitting: Orthopaedic Surgery

## 2018-09-11 DIAGNOSIS — S83206A Unspecified tear of unspecified meniscus, current injury, right knee, initial encounter: Secondary | ICD-10-CM

## 2018-09-18 ENCOUNTER — Ambulatory Visit: Payer: Self-pay | Admitting: Psychiatry

## 2018-10-01 ENCOUNTER — Other Ambulatory Visit: Payer: 59

## 2018-10-04 ENCOUNTER — Telehealth: Payer: Self-pay | Admitting: Psychiatry

## 2018-10-04 DIAGNOSIS — F900 Attention-deficit hyperactivity disorder, predominantly inattentive type: Secondary | ICD-10-CM

## 2018-10-04 MED ORDER — LISDEXAMFETAMINE DIMESYLATE 40 MG PO CAPS: 40.0000 mg | ORAL_CAPSULE | Freq: Every day | ORAL | 0 refills | Status: DC
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
## 2018-10-18 ENCOUNTER — Encounter: Payer: Self-pay | Admitting: Psychiatry

## 2018-10-18 ENCOUNTER — Ambulatory Visit: Payer: 59 | Admitting: Psychiatry

## 2018-10-18 VITALS — Ht 70.0 in | Wt 228.0 lb

## 2018-10-18 DIAGNOSIS — F900 Attention-deficit hyperactivity disorder, predominantly inattentive type: Secondary | ICD-10-CM

## 2018-10-18 DIAGNOSIS — F5081 Binge eating disorder: Secondary | ICD-10-CM

## 2018-10-18 DIAGNOSIS — F3341 Major depressive disorder, recurrent, in partial remission: Secondary | ICD-10-CM | POA: Insufficient documentation

## 2018-10-18 DIAGNOSIS — F988 Other specified behavioral and emotional disorders with onset usually occurring in childhood and adolescence: Secondary | ICD-10-CM | POA: Insufficient documentation

## 2018-10-18 DIAGNOSIS — F4312 Post-traumatic stress disorder, chronic: Secondary | ICD-10-CM | POA: Diagnosis not present

## 2018-10-18 DIAGNOSIS — F451 Undifferentiated somatoform disorder: Secondary | ICD-10-CM | POA: Diagnosis not present

## 2018-10-18 MED ORDER — VORTIOXETINE HBR 5 MG PO TABS: 5.0000 mg | ORAL_TABLET | Freq: Every day | ORAL | 0 refills | Status: DC
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
## 2018-10-31 ENCOUNTER — Telehealth: Payer: Self-pay | Admitting: Psychiatry

## 2018-10-31 ENCOUNTER — Other Ambulatory Visit: Payer: Self-pay

## 2018-10-31 MED ORDER — VORTIOXETINE HBR 5 MG PO TABS: 5.0000 mg | ORAL_TABLET | Freq: Every day | ORAL | 0 refills | Status: DC

## 2018-10-31 NOTE — Addendum Note (Signed)
Addended by: Delight Hoh on: 10/31/2018 05:56 PM   Modules accepted: Orders

## 2018-10-31 NOTE — Telephone Encounter (Signed)
Please address

## 2018-10-31 NOTE — Telephone Encounter (Signed)
Patient requesting a prescription for Trintellix. [facility] [address]

## 2018-10-31 NOTE — Telephone Encounter (Signed)
Patient requests a prescription for Trintellix 5 mg #30 no refill having [facility] coupon [facility] [place] having follow-up appointment in a couple of weeks.

## 2018-11-05 ENCOUNTER — Telehealth: Payer: Self-pay | Admitting: Psychiatry

## 2018-11-05 NOTE — Telephone Encounter (Signed)
Deanna Ballard called to report that the pharmacy has her Trintellix prescription on hold.  They are showing a contraindication and won't fill until you ok it.  [facility] [address]

## 2018-11-05 NOTE — Telephone Encounter (Signed)
[facility] [address] [facility]oned at patient's request as before they allow patient to pick up Trintellix 5 mg daily they are concerned for interaction possible with Vyvanse for serotonin syndrome.  Though the Internet records Trintellix as atypical antidepressant, it has more SSRI mechanisms and not MAOI.  Certainly Trintellix could increase the adrenergic effects of Vyvanse but she has taken her Trintellix for 2 weeks at 5 mg daily without adverse effect when also taking the Vyvanse so that prescription for 5 mg daily can be reassured to the pharmacy to no side effects or interactions thus far and to be acceptable to fill with the prescribed directions.

## 2018-11-12 ENCOUNTER — Ambulatory Visit: Payer: 59 | Admitting: Psychiatry

## 2018-11-19 ENCOUNTER — Other Ambulatory Visit: Payer: Self-pay

## 2018-11-19 ENCOUNTER — Telehealth: Payer: Self-pay | Admitting: Psychiatry

## 2018-11-19 DIAGNOSIS — F900 Attention-deficit hyperactivity disorder, predominantly inattentive type: Secondary | ICD-10-CM

## 2018-11-19 MED ORDER — LISDEXAMFETAMINE DIMESYLATE 40 MG PO CAPS: 40.0000 mg | ORAL_CAPSULE | Freq: Every day | ORAL | 0 refills | Status: DC

## 2018-11-19 NOTE — Telephone Encounter (Signed)
Pt needs refill [facility] [address].

## 2018-11-19 NOTE — Telephone Encounter (Signed)
Patient requests refill of Vyvanse 40 mg every morning sent as #30 with [facility] [address] who had phoned expecting they might see serotonin syndrome with Trintellix 5 mg and the Vyvanse 40 mg not expected medically to be a problem including now OK for the medically necessary Vyvanse no contraindication.

## 2018-11-20 ENCOUNTER — Telehealth: Payer: Self-pay | Admitting: Psychiatry

## 2018-11-20 ENCOUNTER — Ambulatory Visit: Payer: Self-pay | Admitting: Neurology

## 2018-11-20 NOTE — Telephone Encounter (Signed)
I returned call reaching only answering machine leaving message that I cannot when she and pharmacist and worrying about weight gain from 5 mg of Trintellix explaining marketing and medication assistance with compulsive overeating, though I understand if she documents weight gain attributable to taking it, she may need to stop it otherwise accept her decision but not wanting to contribute to self defeat.

## 2018-11-20 NOTE — Telephone Encounter (Signed)
Deanna Ballard called with concerns about taking Trintellix.  The pharmacist told her that it could contribute to weight gain so she doesn't want to take this medication.  Please call to discuss this side effect.

## 2018-11-22 ENCOUNTER — Ambulatory Visit: Payer: 59 | Admitting: Psychiatry

## 2018-12-17 ENCOUNTER — Ambulatory Visit (INDEPENDENT_AMBULATORY_CARE_PROVIDER_SITE_OTHER): Payer: 59 | Admitting: Psychiatry

## 2018-12-17 ENCOUNTER — Encounter: Payer: Self-pay | Admitting: Psychiatry

## 2018-12-17 DIAGNOSIS — F3341 Major depressive disorder, recurrent, in partial remission: Secondary | ICD-10-CM

## 2018-12-17 DIAGNOSIS — F4312 Post-traumatic stress disorder, chronic: Secondary | ICD-10-CM | POA: Diagnosis not present

## 2018-12-17 DIAGNOSIS — F5081 Binge eating disorder: Secondary | ICD-10-CM | POA: Diagnosis not present

## 2018-12-17 DIAGNOSIS — F451 Undifferentiated somatoform disorder: Secondary | ICD-10-CM

## 2018-12-17 DIAGNOSIS — F900 Attention-deficit hyperactivity disorder, predominantly inattentive type: Secondary | ICD-10-CM

## 2018-12-17 MED ORDER — LISDEXAMFETAMINE DIMESYLATE 40 MG PO CAPS: 40.0000 mg | ORAL_CAPSULE | Freq: Every day | ORAL | 0 refills | Status: DC
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
## 2018-12-26 ENCOUNTER — Telehealth: Payer: Self-pay

## 2018-12-26 DIAGNOSIS — F900 Attention-deficit hyperactivity disorder, predominantly inattentive type: Secondary | ICD-10-CM

## 2018-12-26 MED ORDER — LISDEXAMFETAMINE DIMESYLATE 40 MG PO CAPS: 40.0000 mg | ORAL_CAPSULE | Freq: Every day | ORAL | 0 refills | Status: DC

## 2018-12-26 NOTE — Telephone Encounter (Signed)
[facility] [address] [facility] request for Vyvanse 40 mg, said they accidentally deleted a rx off her profile. Last fill 11/19/2018

## 2018-12-26 NOTE — Telephone Encounter (Signed)
Patient was seen 12/17/2018 after being provided last Vyvanse E scription 11/19/2018 filled that day laying fill of 12/17/2018 fill until today 12/26/2018 but [facility] [address] has deleted the 12/17/2018 scription from their log so replaced as of today.

## 2019-01-14 ENCOUNTER — Telehealth: Payer: Self-pay | Admitting: Psychiatry

## 2019-01-14 DIAGNOSIS — F4312 Post-traumatic stress disorder, chronic: Secondary | ICD-10-CM

## 2019-01-14 MED ORDER — DIAZEPAM 10 MG PO TABS: 10.0000 mg | ORAL_TABLET | Freq: Two times a day (BID) | ORAL | 0 refills | Status: DC | PRN

## 2019-01-14 NOTE — Telephone Encounter (Signed)
As of last appointment patient had remaining supply of Valium for PTSD just needing her Vyvanse for binge eating disorder and ADHD then.  She now is out of Valium prescribed 10 mg twice daily as needed for anxiety of PTSD #60 with no refill [facility] [address] with contraindication.

## 2019-01-14 NOTE — Telephone Encounter (Signed)
Pharmacy requests valium and xanax?

## 2019-01-14 NOTE — Telephone Encounter (Signed)
Pt left message wants refill on Diazapam 10mg   1/d  that will work  with Vyvanse to help with anxiety. Pt had a few and started  worked well. Talked with Pharmacist before starting. [facility] [address] [place]

## 2019-06-12 ENCOUNTER — Ambulatory Visit: Payer: 59 | Admitting: Psychiatry

## 2019-11-25 ENCOUNTER — Telehealth: Payer: Self-pay | Admitting: Psychiatry

## 2019-11-25 NOTE — Telephone Encounter (Signed)
Pt would like refill on her Vyvanse, and Diazepam. [facility] [address].

## 2019-11-25 NOTE — Telephone Encounter (Signed)
Phone review with patient regarding prescribing boundaries for Adderall and Valium to which she states she has agreed to keep an appointment she made today for March 10 and understands I cannot prescribe without the appointment.  She is satisfied with this plan.

## 2019-11-27 ENCOUNTER — Encounter: Payer: Self-pay | Admitting: Psychiatry

## 2019-11-27 ENCOUNTER — Ambulatory Visit (INDEPENDENT_AMBULATORY_CARE_PROVIDER_SITE_OTHER): Payer: 59 | Admitting: Psychiatry

## 2019-11-27 VITALS — Ht 70.0 in | Wt 258.0 lb

## 2019-11-27 DIAGNOSIS — F4312 Post-traumatic stress disorder, chronic: Secondary | ICD-10-CM | POA: Diagnosis not present

## 2019-11-27 DIAGNOSIS — F5081 Binge eating disorder: Secondary | ICD-10-CM

## 2019-11-27 DIAGNOSIS — F900 Attention-deficit hyperactivity disorder, predominantly inattentive type: Secondary | ICD-10-CM

## 2019-11-27 DIAGNOSIS — F3341 Major depressive disorder, recurrent, in partial remission: Secondary | ICD-10-CM

## 2019-11-27 DIAGNOSIS — F50819 Binge eating disorder, unspecified: Secondary | ICD-10-CM

## 2019-11-27 DIAGNOSIS — F451 Undifferentiated somatoform disorder: Secondary | ICD-10-CM

## 2019-11-27 MED ORDER — DIAZEPAM 10 MG PO TABS: 10.0000 mg | ORAL_TABLET | Freq: Two times a day (BID) | ORAL | 2 refills | Status: DC | PRN

## 2019-11-27 MED ORDER — LISDEXAMFETAMINE DIMESYLATE 40 MG PO CAPS: 40.0000 mg | ORAL_CAPSULE | Freq: Every day | ORAL | 0 refills | Status: DC
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
## 2019-11-28 ENCOUNTER — Telehealth: Payer: Self-pay

## 2019-11-28 NOTE — Telephone Encounter (Signed)
Prior authorization submitted for VYVANSE 40 MG through [facility], approved 11/28/2019-11/27/2022.   Submitted through cover my meds

## 2020-05-14 IMAGING — MR MR ABDOMEN WO/W CM
12 of 17 series · 32 of 48 positions shown · IV contrast (multihance)
Comparison: Ultrasound on 08/22/2017

CLINICAL DATA: Chronic abdominal pain. Possible hepatic mass on
recent ultrasound.

Creatinine was obtained on site at [HOSPITAL] at [HOSPITAL].
Results: Creatinine 0.8 mg/dL.
EXAM:
MRI ABDOMEN WITHOUT AND WITH CONTRAST
TECHNIQUE: Multiplanar multisequence MR imaging of the abdomen was performed
both before and after the administration of intravenous contrast.
CONTRAST:  15mL MULTIHANCE GADOBENATE DIMEGLUMINE 529 MG/ML IV SOLN

[Series 3: T2 · coronal · 5.0mm · 1.64mm/px · 2 of 30 slices shown (1 of 3)]
[im 1/30]
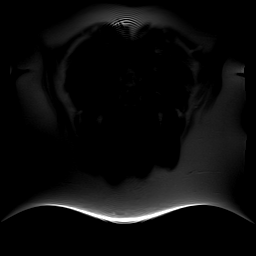
[im 30/30]
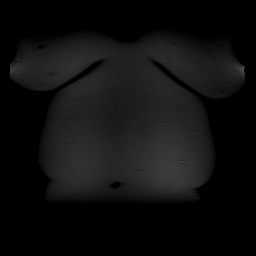

[Series 5: axial tru fisp · axial · 5.0mm · 1.56mm/px · z∈[-105,+130]mm · 3 of 42 slices shown]
[im 1/42]
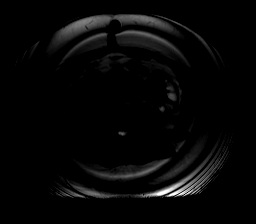
[im 21/42]
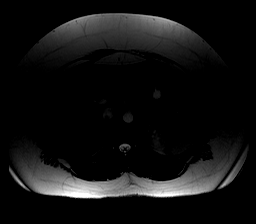
[im 42/42]
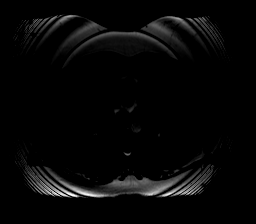

[Series 6: T2 · axial · 6.5mm · 0.78mm/px · z∈[-82,+175]mm · 2 of 34 slices shown (2 of 3)]
[im 1/34]
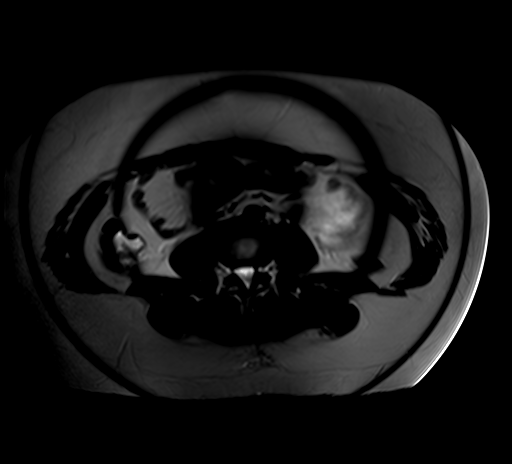
[im 34/34]
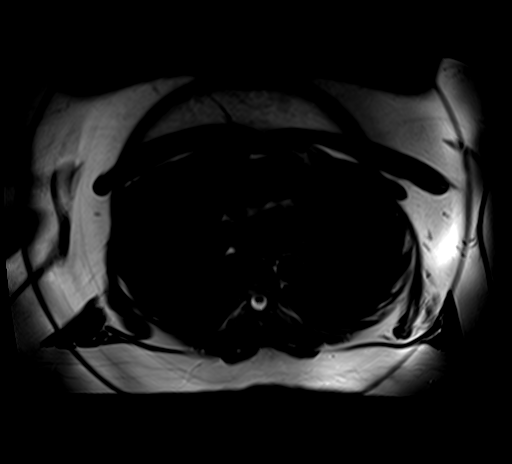

[Series 7: ep2d_diff_b50_500_800_p2 · axial · 6.0mm · 2.08mm/px · z∈[-74,+184]mm · 5 of 102 slices shown]
[im 1/102]
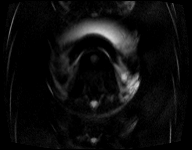
[im 26/102]
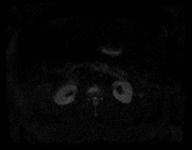
[im 51/102]
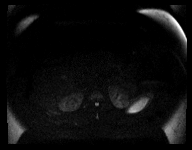
[im 76/102]
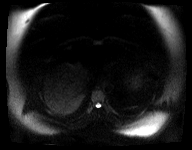
[im 102/102]
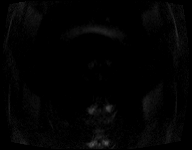

[Series 8: ep2d_diff_b50_500_800_p2_adc · axial · 6.0mm · 2.08mm/px · z∈[-74,+184]mm · 2 of 34 slices shown]
[im 1/34]
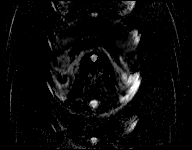
[im 34/34]
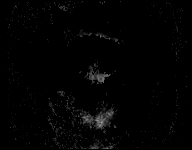

[Series 9: T2 · axial · 5.0mm · 1.60mm/px · 1 of 35 slices shown (3 of 3)]
[im 1/35]
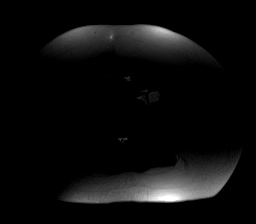

[Series 10: axial in out · axial · 6.0mm · 0.80mm/px · z∈[-98,+123]mm · 3 of 66 slices shown]
[im 1/66]
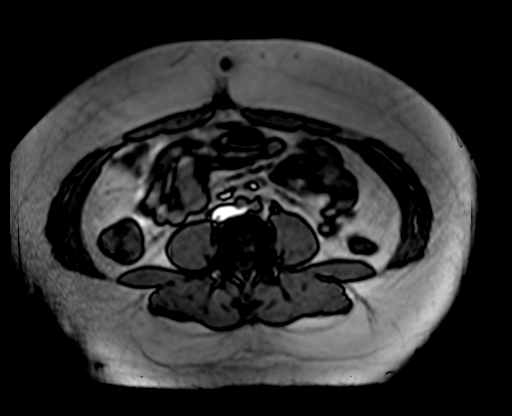
[im 33/66]
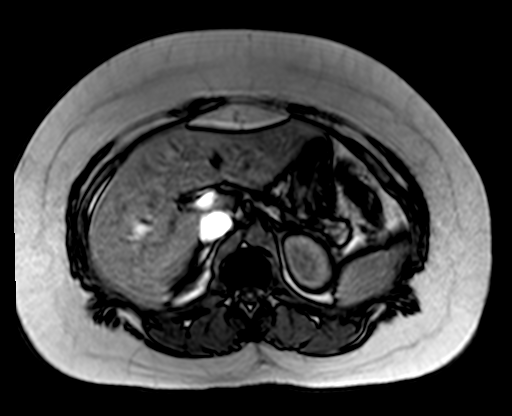
[im 66/66]
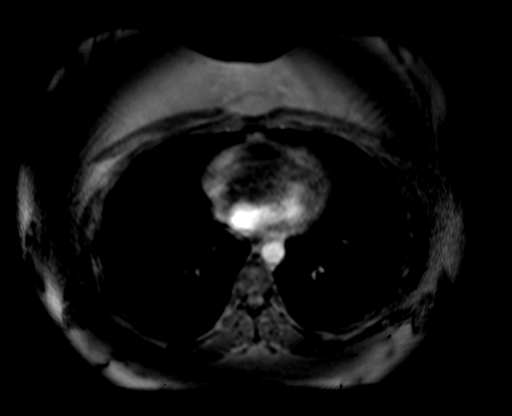

[Series 11: T1 dynamic · axial · non-contrast · 2.2mm · 0.80mm/px · z∈[-83,+108]mm · 3 of 88 slices shown]
[im 1/88]
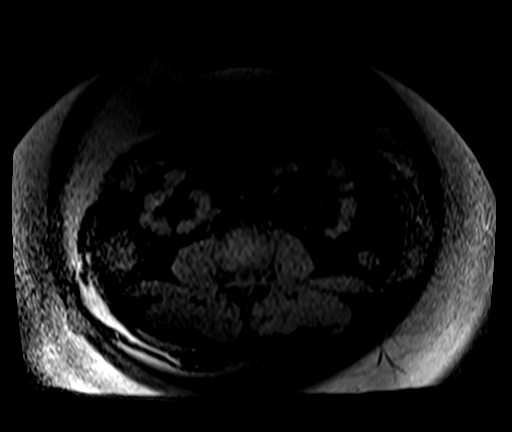
[im 44/88]
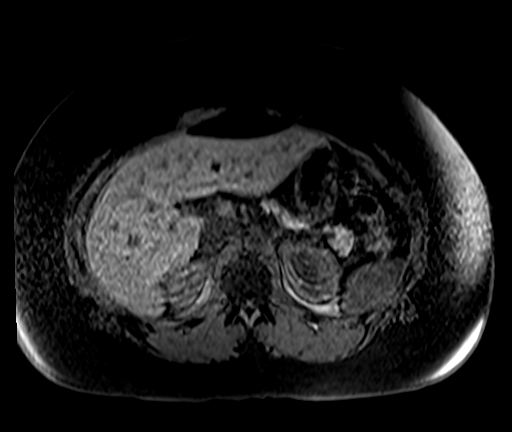
[im 88/88]
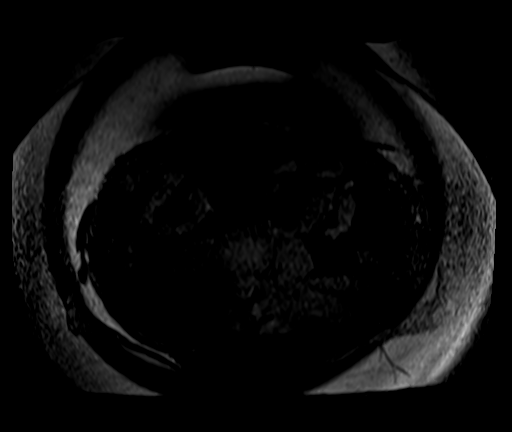

[Series 12: post 25 sec · axial · 2.2mm · 0.80mm/px · z∈[-83,+108]mm · 3 of 88 slices shown]
[im 1/88]
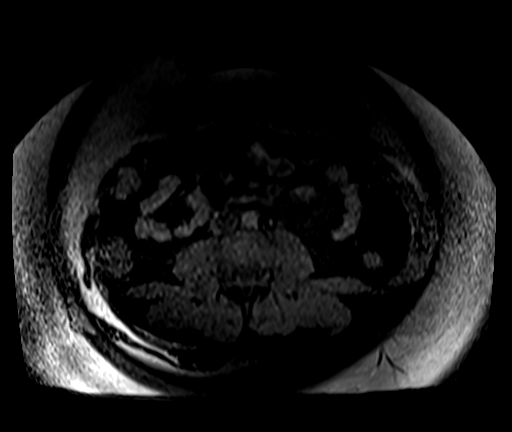
[im 44/88]
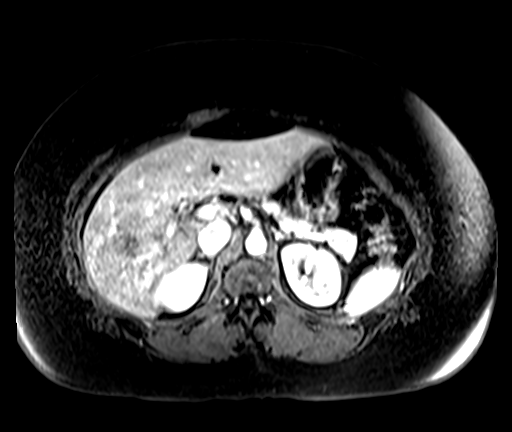
[im 88/88]
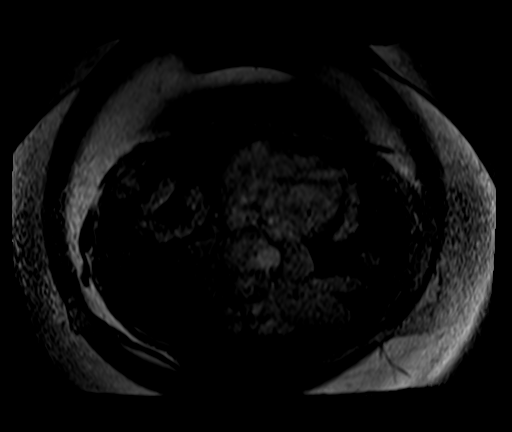

[Series 13: post 25 sec_sub · axial · 2.2mm · 0.80mm/px · z∈[-83,+108]mm · 3 of 88 slices shown]
[im 1/88]
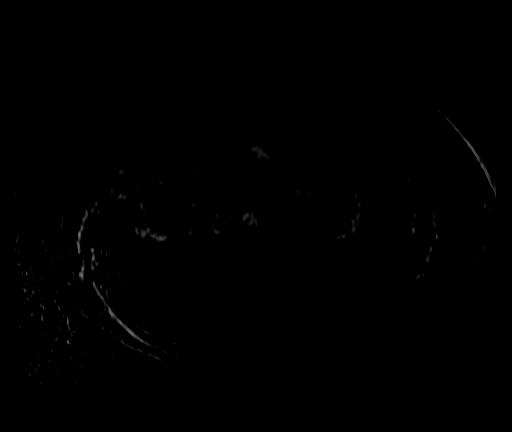
[im 44/88]
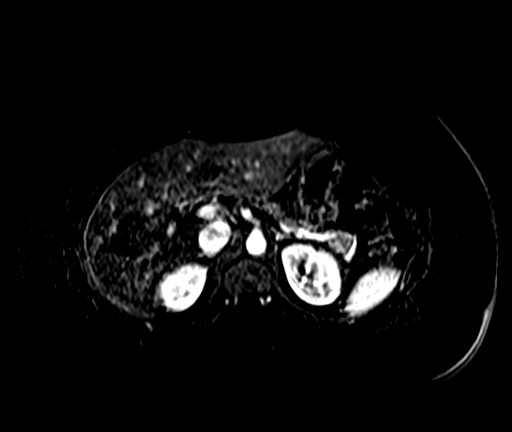
[im 88/88]
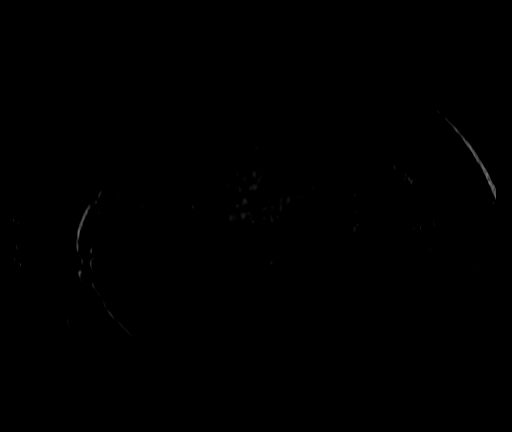

[Series 14: post 45 sec · axial · 2.2mm · 0.80mm/px · z∈[-83,+108]mm · 3 of 88 slices shown]
[im 1/88]
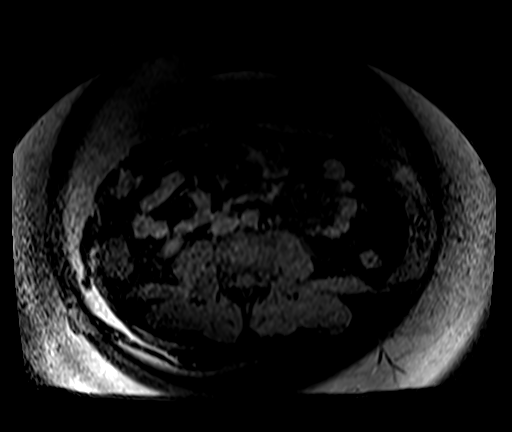
[im 44/88]
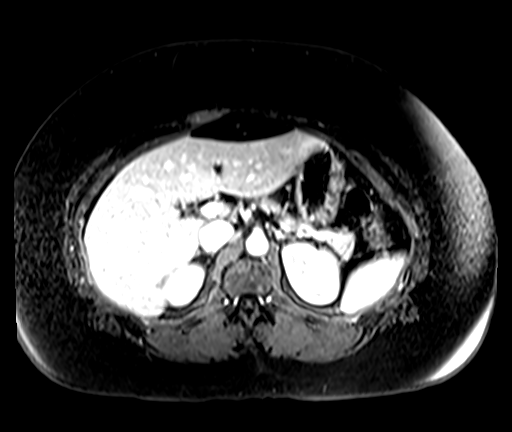
[im 88/88]
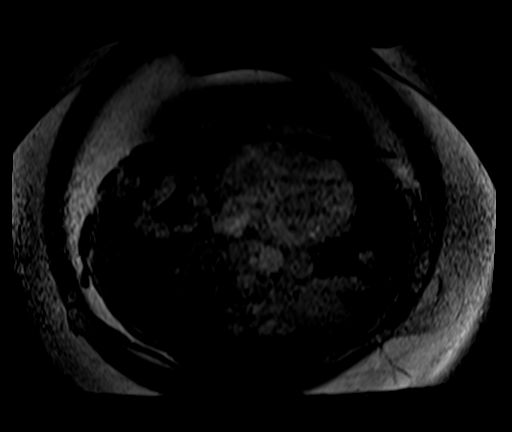

[Series 15: post 45 sec_sub · axial · 2.2mm · 0.80mm/px · z∈[-83,+11]mm · 2 of 88 slices shown]
[im 1/88]
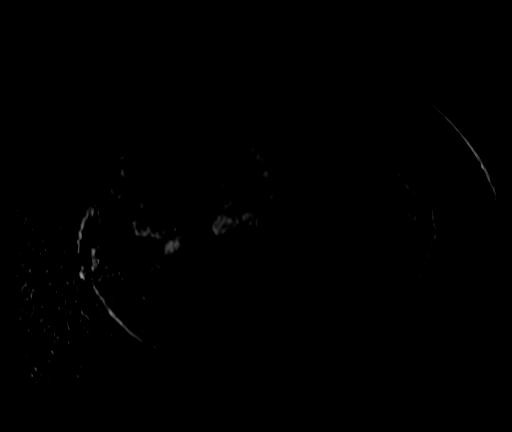
[im 44/88]
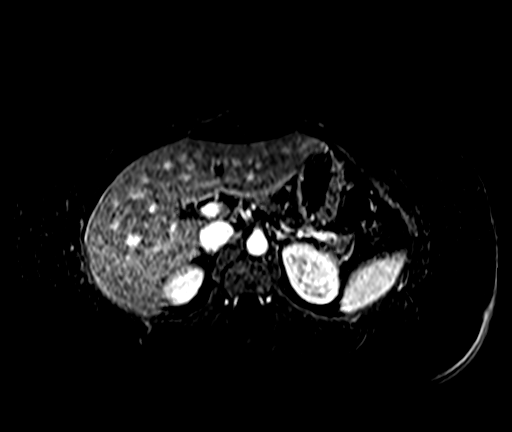

[32 of 48 positions shown; findings below may reference images not displayed]

FINDINGS: Lower chest: No acute findings.

Hepatobiliary: No hepatic masses identified. Gallbladder is
unremarkable. No evidence of biliary ductal dilatation.

Pancreas:  No mass or inflammatory changes.

Spleen:  Within normal limits in size and appearance.

Adrenals/Urinary Tract: No masses identified. No evidence of
hydronephrosis.

Stomach/Bowel: Visualized portion unremarkable.

Vascular/Lymphatic: No pathologically enlarged lymph nodes
identified. No abdominal aortic aneurysm.

Other:  None.

Musculoskeletal:  No suspicious bone lesions identified.
IMPRESSION: Negative. No evidence of hepatic mass or other significant
abnormality.

## 2020-07-08 ENCOUNTER — Encounter: Payer: Self-pay | Admitting: Psychiatry

## 2021-07-20 NOTE — Progress Notes (Signed)
   CC:  headaches  Follow-up Visit  Last visit: 08/09/2018  Brief HPI: 40 year old female who previously followed with Dr. Jannifer Franklin for migraines which have been present since she was a teenager. She was last seen in 2019 and was given Maxalt for rescue. Was also prescribed gabapentin 300 mg BID.  Interval History: Currently has a migraine at least 1-2 times per week. They can last up to 4 days at a time. Has lower level headaches on other days. May have 1-2 pain-free day per week.   For the past few months she has been dizzy (vertigo and light-headedness) when she wakes up in the morning. She is currently taking cyclobenzaprine and Imitrex for her headaches. Flexeril helps her sleep at night. Imitrex doesn't help much. She would like to try [place] as she saw commercials for it on TV  Headache days per month: 20 Headache free days per month: 10  Current Headache Regimen: Preventative: none Abortive: Imitrex 25 mg PRN   Prior Therapies                                  Lisinopril - allergy Gabapentin 300 mg BID Imitrex 25-50 mg PRN Maxalt 10 mg PRN  Physical Exam:   Vital Signs: BP 128/86   Pulse (!) 112   Ht 5\' 10"  (1.778 m)   Wt 271 lb (122.9 kg)   SpO2 98%   BMI 38.88 kg/m  GENERAL:  well appearing, in no acute distress, alert  SKIN:  Color, texture, turgor normal. No rashes or lesions HEAD:  Normocephalic/atraumatic. RESP: normal respiratory effort MSK:  +mildly limited cervical ROM  NEUROLOGICAL: Mental Status: Alert, oriented to person, place and time, Follows commands, and Speech fluent and appropriate. Cranial Nerves: PERRL, face symmetric, no dysarthria, hearing grossly intact Motor: moves all extremities equally Gait: normal-based.  IMPRESSION: 40 year old female who presents for follow up of migraines. She is currently having headaches almost every day. Discussed preventive medication options. She would prefer to avoid Topamax as she works in [employer] and is  concerned for cognitive side effects. Will start Zonisamide for headache prevention. For rescue will start Roselyn Meier as she has previously failed two triptans. Referral to neck PT placed to help with her cervicalgia. Dizziness may be related to her migraines, will monitor for now to see if it improves with headache treatment.   PLAN: -Preventive: Start Zonisamide 100 mg daily x2 weeks, then increase to 200 mg daily -Rescue: Start Ubrelvy 100 mg PRN -Referral to neck PT -next steps: consider propranolol, CGRP, botox  Follow-up: 3 months  I spent a total of 29 minutes on the date of the service. Headache education was done.  Discussed treatment options including preventive and acute medications and physical therapy. Discussed medication side effects, adverse reactions and drug interactions. Written educational materials and patient instructions outlining all of the above were given.  Genia Harold, MD 07/26/21 4:09 PM

## 2021-07-26 ENCOUNTER — Ambulatory Visit: Payer: 59 | Admitting: Psychiatry

## 2021-07-26 ENCOUNTER — Encounter: Payer: Self-pay | Admitting: Psychiatry

## 2021-07-26 ENCOUNTER — Telehealth: Payer: Self-pay | Admitting: *Deleted

## 2021-07-26 VITALS — BP 128/86 | HR 112 | Ht 70.0 in | Wt 271.0 lb

## 2021-07-26 DIAGNOSIS — Z0289 Encounter for other administrative examinations: Secondary | ICD-10-CM

## 2021-07-26 DIAGNOSIS — M542 Cervicalgia: Secondary | ICD-10-CM

## 2021-07-26 DIAGNOSIS — G43709 Chronic migraine without aura, not intractable, without status migrainosus: Secondary | ICD-10-CM

## 2021-07-26 MED ORDER — UBRELVY 100 MG PO TABS: 100.0000 mg | ORAL_TABLET | ORAL | 2 refills | Status: DC | PRN

## 2021-07-26 MED ORDER — ZONISAMIDE 100 MG PO CAPS: ORAL_CAPSULE | ORAL | 3 refills | Status: DC

## 2021-07-26 NOTE — Patient Instructions (Addendum)
Start Zonisamide 100 mg daily for two weeks, then increase to 200 mg daily. Start [place] as needed for migraines. Take 100 mg at earliest sign of migraine. May repeat once in 2 hours.  3.   Referral to physical therapy for the neck

## 2021-07-26 NOTE — Telephone Encounter (Signed)
Roselyn Meier PA, key  [id number], [id number]. failed Imitrex, Maxalt.  Roselyn Meier approved 07/26/21 - 07/26/2022. Faxed approval letter to pharmacy.

## 2021-07-28 ENCOUNTER — Telehealth: Payer: Self-pay

## 2021-07-28 NOTE — Telephone Encounter (Signed)
Received work Camera [employer] form from [place]. Pt is requestion forms to be filled out so she can work from home 3 days per week. Will forward message to MD to see if we can pursue this.

## 2021-07-28 NOTE — Telephone Encounter (Signed)
Accomodation form has been completed and placed in MD's office for review and signature if appropriate.

## 2021-07-28 NOTE — Telephone Encounter (Signed)
Sure, I'll fill it out. Thanks

## 2021-07-29 NOTE — Telephone Encounter (Signed)
Accomodation form has been reviewed by MD and signed. I have fwd back to medical records for processing.

## 2021-07-29 NOTE — Telephone Encounter (Signed)
Deanna Ballard) called, have some questions about accomodation letter. Trying to understand why a few days in the office and few days at home is medically beneficial.  Contact info: [phone]

## 2021-07-29 NOTE — Telephone Encounter (Signed)
Medical records has been in touch with Deanna Ballard and last office note has been sent. Hopefully this will help with this issue.

## 2021-08-04 ENCOUNTER — Telehealth: Payer: Self-pay | Admitting: *Deleted

## 2021-08-04 NOTE — Telephone Encounter (Signed)
I faxed pt accommodation letter and her medical records. Pt needs to contact  [place] for an update on her form.

## 2021-09-01 ENCOUNTER — Ambulatory Visit: Payer: 59

## 2021-09-27 ENCOUNTER — Ambulatory Visit: Payer: 59 | Admitting: Neurology

## 2021-10-27 ENCOUNTER — Ambulatory Visit: Payer: 59 | Admitting: Psychiatry

## 2021-10-27 ENCOUNTER — Encounter: Payer: Self-pay | Admitting: Psychiatry

## 2021-10-27 NOTE — Progress Notes (Deleted)
° °  CC:  headaches  Follow-up Visit  Last visit: 07/26/21  Brief HPI: 41 year old female who follows in clinic for migraines.  At her last visit she was started on Zonisamide for headache prevention and Ubrelvy for rescue. Referral to neck PT was placed.  Interval History: Since her last visit***   Headache days per month: *** Headache free days per month: *** Headache severity: ***  Current Headache Regimen: Preventative: *** Abortive: ***  # of doses of abortive medications per month: ***  Prior Therapies                                  ***  Physical Exam:   Vital Signs: There were no vitals taken for this visit. GENERAL:  well appearing, in no acute distress, alert  SKIN:  Color, texture, turgor normal. No rashes or lesions HEAD:  Normocephalic/atraumatic. RESP: normal respiratory effort MSK:  No gross joint deformities.   NEUROLOGICAL: Mental Status: Alert, oriented to person, place and time, Follows commands, and Speech fluent and appropriate. Cranial Nerves: PERRL, face symmetric, no dysarthria, hearing grossly intact Motor: moves all extremities equally Gait: normal-based.  IMPRESSION: ***  PLAN: ***   Follow-up: ***  I spent a total of *** minutes on the date of the service. Headache education was done. Discussed lifestyle modification including increased oral hydration, decreased caffeine, exercise and stress management. Discussed treatment options including preventive and acute medications, natural supplements, and infusion therapy. Discussed medication overuse headache and to limit use of acute treatments to no more than 2 days/week or 10 days/month. Discussed medication side effects, adverse reactions and drug interactions. Written educational materials and patient instructions outlining all of the above were given.  Genia Harold, MD

## 2021-10-31 ENCOUNTER — Other Ambulatory Visit: Payer: Self-pay | Admitting: Psychiatry

## 2022-03-29 DIAGNOSIS — Z0279 Encounter for issue of other medical certificate: Secondary | ICD-10-CM

## 2022-06-29 ENCOUNTER — Ambulatory Visit: Payer: 59 | Admitting: Cardiovascular Disease

## 2022-07-07 ENCOUNTER — Telehealth: Payer: Self-pay | Admitting: Psychiatry

## 2022-07-07 NOTE — Telephone Encounter (Signed)
Called pt back and informed her of the $ 50.00 fee and that she could fax or bring the forms to the office. Pt said okay and thank you.

## 2022-07-07 NOTE — Telephone Encounter (Signed)
Pt is calling. Stated she has some accomodation paper-work that she would like Dr. Billey Gosling to fill out. Pt is asking will she fill out before her appointment in April. Pt is requesting a call back from nurse.

## 2022-07-11 DIAGNOSIS — Z0289 Encounter for other administrative examinations: Secondary | ICD-10-CM

## 2022-07-12 NOTE — Telephone Encounter (Signed)
Received forms from [place] request. Placed on MD desk for completion.

## 2022-07-13 NOTE — Telephone Encounter (Signed)
Dr Billey Gosling completed, signed [employer] forms. Sent to medical records for processing.

## 2022-07-26 ENCOUNTER — Telehealth: Payer: Self-pay | Admitting: *Deleted

## 2022-07-26 NOTE — Telephone Encounter (Signed)
Roselyn Meier PA,  Key: [id number]. This request is approved from 07/26/2022 to 07/26/2023.

## 2022-07-29 ENCOUNTER — Ambulatory Visit: Payer: 59 | Attending: Cardiovascular Disease | Admitting: Cardiovascular Disease

## 2022-07-29 ENCOUNTER — Ambulatory Visit: Payer: 59 | Attending: Cardiovascular Disease

## 2022-07-29 ENCOUNTER — Encounter: Payer: Self-pay | Admitting: Cardiovascular Disease

## 2022-07-29 VITALS — BP 127/88 | HR 101 | Ht 70.0 in | Wt 267.2 lb

## 2022-07-29 DIAGNOSIS — R002 Palpitations: Secondary | ICD-10-CM

## 2022-07-29 DIAGNOSIS — E7800 Pure hypercholesterolemia, unspecified: Secondary | ICD-10-CM | POA: Diagnosis not present

## 2022-07-29 DIAGNOSIS — Q699 Polydactyly, unspecified: Secondary | ICD-10-CM | POA: Diagnosis not present

## 2022-07-29 NOTE — Progress Notes (Unsigned)
Enrolled patient for a 7 day Zio XT monitor to be mailed to patients home.  

## 2022-07-29 NOTE — Patient Instructions (Addendum)
Medication Instructions:  Your physician recommends that you continue on your current medications as directed. Please refer to the Current Medication list given to you today.  *If you need a refill on your cardiac medications before your next appointment, please call your pharmacy*  Lab Work: NONE ordered at this time of appointment   If you have labs (blood work) drawn today and your tests are completely normal, you will receive your results only by: [place] (if you have MyChart) OR A paper copy in the mail If you have any lab test that is abnormal or we need to change your treatment, we will call you to review the results.  Testing/Procedures: Your physician has requested that you have an echocardiogram. Echocardiography is a painless test that uses sound waves to create images of your heart. It provides your doctor with information about the size and shape of your heart and how well your heart's chambers and valves are working. This procedure takes approximately one hour. There are no restrictions for this procedure. Please do NOT wear cologne, perfume, aftershave, or lotions (deodorant is allowed). Please arrive 15 minutes prior to your appointment time.  ZIO XT- Long Term Monitor Instructions  Your physician has requested you wear a ZIO patch monitor for 7 days.  This is a single patch monitor. [employer] supplies one patch monitor per enrollment. Additional stickers are not available. Please do not apply patch if you will be having a Nuclear Stress Test,  Echocardiogram, Cardiac CT, MRI, or Chest Xray during the period you would be wearing the  monitor. The patch cannot be worn during these tests. You cannot remove and re-apply the  ZIO XT patch monitor.  Your ZIO patch monitor will be mailed 3 day USPS to your address on file. It may take 3-5 days  to receive your monitor after you have been enrolled.  Once you have received your monitor, please review the enclosed  instructions. Your monitor  has already been registered assigning a specific monitor serial # to you.  Billing and Patient Assistance Program Information  We have supplied [employer] with any of your insurance information on file for billing purposes. [employer] offers a sliding scale Patient Assistance Program for patients that do not have  insurance, or whose insurance does not completely cover the cost of the ZIO monitor.  You must apply for the Patient Assistance Program to qualify for this discounted rate.  To apply, please call [employer] at [phone], select option 4, select option 2, ask to apply for  Patient Assistance Program. Deanna Ballard will ask your household income, and how many people  are in your household. They will quote your out-of-pocket cost based on that information.  [employer] will also be able to set up a 55-month interest-free payment plan if needed.  Applying the monitor   Shave hair from upper left chest.  Hold abrader disc by orange tab. Rub abrader in 40 strokes over the upper left chest as  indicated in your monitor instructions.  Clean area with 4 enclosed alcohol pads. Let dry.  Apply patch as indicated in monitor instructions. Patch will be placed under collarbone on left  side of chest with arrow pointing upward.  Rub patch adhesive wings for 2 minutes. Remove white label marked "1". Remove the white  label marked "2". Rub patch adhesive wings for 2 additional minutes.  While looking in a mirror, press and release button in center of patch. A small green light will  flash 3-4 times. This  will be your only indicator that the monitor has been turned on.  Do not shower for the first 24 hours. You may shower after the first 24 hours.  Press the button if you feel a symptom. You will hear a small click. Record Date, Time and  Symptom in the Patient Logbook.  When you are ready to remove the patch, follow instructions on the last 2 pages of Patient  Logbook. Stick patch  monitor onto the last page of Patient Logbook.  Place Patient Logbook in the blue and white box. Use locking tab on box and tape box closed  securely. The blue and white box has prepaid postage on it. Please place it in the mailbox as  soon as possible. Your physician should have your test results approximately 7 days after the  monitor has been mailed back to [facility].  Call [place] at [phone] if you have questions regarding  your ZIO XT patch monitor. Call them immediately if you see an orange light blinking on your  monitor.  If your monitor falls off in less than 4 days, contact our Monitor department at [phone].  If your monitor becomes loose or falls off after 4 days call [employer] at [phone] for  suggestions on securing your monitor  Follow-Up: At [facility], you and your health needs are our priority.  As part of our continuing mission to provide you with exceptional heart care, we have created designated Provider Care Teams.  These Care Teams include your primary Cardiologist (physician) and Advanced Practice Providers (APPs -  Physician Assistants and Nurse Practitioners) who all work together to provide you with the care you need, when you need it.  We recommend signing up for the patient portal called "MyChart".  Sign up information is provided on this After Visit Summary.  MyChart is used to connect with patients for Virtual Visits (Telemedicine).  Patients are able to view lab/test results, encounter notes, upcoming appointments, etc.  Non-urgent messages can be sent to your provider as well.   To learn more about what you can do with MyChart, go to [email or web address].    Your next appointment:   1 year(s)  The format for your next appointment:   In Person  Provider:   Sanda Klein, MD     Other Instructions [email or web address] [email or web address]        FDA-cleared, clinical grade  mobile EKG monitor: Deanna Ballard is the most clinically-validated mobile EKG used by the world's leading cardiac care medical professionals With Basic service, know instantly if your heart rhythm is normal or if atrial fibrillation is detected, and email the last single EKG recording to yourself or your doctor Premium service, available for purchase through the Kardia app for $9.99 per month or $99 per year, includes unlimited history and storage of your EKG recordings, a monthly EKG summary report to share with your doctor, along with the ability to track your blood pressure, activity and weight Includes one KardiaMobile phone clip FREE SHIPPING: Standard delivery 1-3 business days. Orders placed by 11:00am PST will ship that afternoon. Otherwise, will ship next business day. All orders ship via [employer] from [place], [place] - sending an EKG [employer] and set up profile. Run EKG - by placing 1-2 fingers on the silver plates After EKG is complete - Download PDF  - Skip password (if you apply a password the provider will need it to view the EKG) Click share button (square  with upward arrow) in bottom left corner To send: choose MyChart (first time log into MyChart)  Pop up window about sending ECG Click continue Choose type of message Choose provider Type subject and message Click send (EKG should be attached)  - To send additional EKGs in one message click the paperclip image and bottom of page to attach.     Important Information About Sugar

## 2022-07-29 NOTE — Progress Notes (Signed)
Cardiology Office Note:    Date:  07/29/2022   ID:  BENNY HENRIE, DOB 1981/02/14, MRN [id number]  PCP:  Hilbert Bible   [facility] Cardiologist:  Sanda Klein, MD     Referring MD: Michel Harrow, PA-C   Chief Complaint  Patient presents with   Palpitations  Deanna Ballard is a 41 y.o. female who is being seen today for the evaluation of palpitations at the request of Michel Harrow, [place].   History of Present Illness:    Deanna Ballard is a 41 y.o. female with a hx of migraines, dyslipidemia, right-sided sciatica, possible reactive airway disease, polydactyly, who has had intermittent issues with rapid palpitations.  Palpitations onset is usually very abrupt, including waking her from sleep.  Her heart is beating "out of control", both very fast and very strong, but regular.  Resolution of arrhythmia tends to be more gradual.  She has not had a severe episode since March when the arrhythmia lasted pretty much all day.  She feels anxious and unwell when she has the palpitations but does not have syncope/presyncope, shortness of breath or chest pain.  She is relatively sedentary due to problems with her right ankle, right knee and back.  She has a history of a fracture of the right ankle requiring surgery on both the tibia and the fibula.  She currently has neuralgia radiating down the back of her right buttock and thigh consistent with sciatica and will be seeing an neurological specialist soon.  She denies exertional dyspnea or chest pain.  She does not have lower extremity edema or claudication.  She denies any other focal neurological events or symptoms.  She denies daytime hypersomnolence.  Father has coronary disease.  He received a stent around the age of 47.  He also has polydactyly.  This "runs in the family".  She is severely obese with a BMI over 38.  She does not have diabetes mellitus or hypertension.  She was prescribed  antihypertensive medicines a few years ago, but retrospectively her blood pressure was elevated due to pain and emotional distress.  She currently has normal blood pressure without medications.  She has elevated cholesterol (total 237, calculated LDL 157) and she blames this on recent problems with sedentary lifestyle and poor diet.  He does not smoke. She has normal renal function.  She has been prescribed albuterol, as needed for wheezing.  She has used the albuterol inhaler very infrequently.  There is no association with her episodes of rapid palpitations.  Past Medical History:  Diagnosis Date   Anemia    Anxiety    Chronic back pain    Common migraine with intractable migraine 08/09/2018   Constipation    Depression    Dyspnea    GERD (gastroesophageal reflux disease)    Headache    Migraines   High cholesterol    HTN (hypertension)    during stressful time, never took the medications   Indigestion    Irregular heart beat    Major depressive disorder, recurrent episode, mild with atypical features ([place]) 08/08/2018   Migraine    Prolonged posttraumatic stress disorder 08/08/2018    Past Surgical History:  Procedure Laterality Date   HYSTEROSCOPY WITH D & C N/A 09/26/2016   Procedure: DILATATION AND CURETTAGE /HYSTEROSCOPY WITH POLYPECTOMY;  Surgeon: Delsa Bern, MD;  Location: [facility];  Service: Gynecology;  Laterality: N/A;   INTRAUTERINE DEVICE (IUD) INSERTION N/A 09/26/2016   Procedure:  INTRAUTERINE DEVICE (IUD) INSERTION;  Surgeon: Delsa Bern, MD;  Location: [facility];  Service: Gynecology;  Laterality: N/A;   IUD REMOVAL     ORIF ANKLE FRACTURE Right 08/27/2018   ORIF ANKLE FRACTURE Right 08/27/2018   Procedure: OPEN REDUCTION INTERNAL FIXATION (ORIF) ANKLE FRACTURE;  Surgeon: Erle Crocker, MD;  Location: [facility];  Service: Orthopedics;  Laterality: Right;   ROBOTIC ASSISTED LAPAROSCOPIC OVARIAN CYSTECTOMY Right 09/26/2016   Procedure: ROBOTIC ASSISTED LAPAROSCOPIC  OVARIAN CYSTECTOMY, PELVIC WASHINGS;  Surgeon: Delsa Bern, MD;  Location: [facility];  Service: Gynecology;  Laterality: Right;    Current Medications: No outpatient medications have been marked as taking for the 07/29/22 encounter (Office Visit) with Sanda Klein, MD.     Allergies:   Amlodipine and Lisinopril   Social History   Socioeconomic History   Marital status: Single    Spouse name: Not on file   Number of children: Not on file   Years of education: Not on file   Highest education level: Bachelor's degree (e.g., BA, AB, BS)  Occupational History   Not on file  Tobacco Use   Smoking status: Never   Smokeless tobacco: Never  Vaping Use   Vaping Use: Never used  Substance and Sexual Activity   Alcohol use: Not Currently    Comment: socially   Drug use: No   Sexual activity: Not on file  Other Topics Concern   Not on file  Social History Narrative   Right handed   Lives at home    Caffeine- minimal use    Social Determinants of Health   Financial Resource Strain: Not on file  Food Insecurity: Not on file  Transportation Needs: Not on file  Physical Activity: Not on file  Stress: Not on file  Social Connections: Not on file     Family History: The patient's family history includes Diabetes in her mother; Healthy in her father; Hypertension in her mother; Migraines in her sister.  ROS:   Please see the history of present illness.     All other systems reviewed and are negative.  EKGs/Labs/Other Studies Reviewed:    The following studies were reviewed today: Notes from PCP  EKG:  EKG is  ordered today.  The ekg ordered today demonstrates borderline sinus tachycardia at 101 bpm, possible left atrial abnormality.  There are no repolarization abnormalities.  QTc 448 ms.  Recent Labs: No results found for requested labs within last 365 days.  06/13/2022 Hemoglobin 11.4, creatinine 0.9, potassium 4.4, ALT 14  TSH was 1.480 checked in August 2020 Recent  Lipid Panel No results found for: "CHOL", "TRIG", "HDL", "CHOLHDL", "VLDL", "LDLCALC", "LDLDIRECT" 06/13/2022 Cholesterol 237, HDL 60, LDL 157, triglycerides 109   Risk Assessment/Calculations:                Physical Exam:    VS:  BP 127/88   Pulse (!) 101   Ht '5\' 10"'$  (1.778 m)   Wt 121.2 kg   SpO2 96%   BMI 38.34 kg/m     Wt Readings from Last 3 Encounters:  07/29/22 121.2 kg  07/26/21 122.9 kg  08/27/18 102.5 kg     GEN: Severely obese, well nourished, well developed in no acute distress HEENT: Normal NECK: No JVD; No carotid bruits LYMPHATICS: No lymphadenopathy CARDIAC: RRR, no murmurs, rubs, gallops RESPIRATORY:  Clear to auscultation without rales, wheezing or rhonchi  ABDOMEN: Soft, non-tender, non-distended MUSCULOSKELETAL:  No edema; she has polydactyly bilaterally (extra little finger). SKIN:  Warm and dry NEUROLOGIC:  Alert and oriented x 3 PSYCHIATRIC:  Normal affect   ASSESSMENT:    1. Palpitations   2. Polydactyly of both hands   3. Severe obesity (BMI 35.0-39.9) with comorbidity ([place])   4. Hypercholesterolemia    PLAN:    In order of problems listed above:  Palpitations: Onset appears to be abrupt, but resolution is gradual, sounding more like sinus tachycardia rather than a true arrhythmia.  We will have her wear an event monitor, but also encouraged her to purchase a commercially available recorder such as a Kardia device or a smart watch, since her episodes are infrequent and we may not catch them on a monitor.  She does not have clinical findings of hyperthyroidism and had a normal TSH last year. 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  We will schedule for an echocardiogram. Severe obesity: Associated with borderline hypertension and dyslipidemia.  She does not have overt symptoms suggestive of obstructive sleep apnea. Hypercholesterolemia: LDL  is very close to 160 and with her family history of CAD I am tempted to recommend lipid-lowering therapy.  She does not want to start more medicines.  She would like to improve her diet and exercise and have this rechecked.  We will follow-up on this.           Medication Adjustments/Labs and Tests Ordered: Current medicines are reviewed at length with the patient today.  Concerns regarding medicines are outlined above.  Orders Placed This Encounter  Procedures   LONG TERM MONITOR (3-14 DAYS)   EKG 12-Lead   ECHOCARDIOGRAM COMPLETE   No orders of the defined types were placed in this encounter.   Patient Instructions  Medication Instructions:  Your physician recommends that you continue on your current medications as directed. Please refer to the Current Medication list given to you today.  *If you need a refill on your cardiac medications before your next appointment, please call your pharmacy*  Lab Work: NONE ordered at this time of appointment   If you have labs (blood work) drawn today and your tests are completely normal, you will receive your results only by: [place] (if you have MyChart) OR A paper copy in the mail If you have any lab test that is abnormal or we need to change your treatment, we will call you to review the results.  Testing/Procedures: Your physician has requested that you have an echocardiogram. Echocardiography is a painless test that uses sound waves to create images of your heart. It provides your doctor with information about the size and shape of your heart and how well your heart's chambers and valves are working. This procedure takes approximately one hour. There are no restrictions for this procedure. Please do NOT wear cologne, perfume, aftershave, or lotions (deodorant is allowed). Please arrive 15 minutes prior to your appointment time.  ZIO XT- Long Term Monitor Instructions  Your physician has requested you wear a ZIO patch monitor  for 7 days.  This is a single patch monitor. [employer] supplies one patch monitor per enrollment. Additional stickers are not available. Please do not apply patch if you will be having a Nuclear Stress Test,  Echocardiogram, Cardiac CT, MRI, or Chest Xray during the period you would be wearing the  monitor. The patch cannot be worn during these tests. You cannot remove and re-apply the  ZIO XT patch monitor.  Your ZIO patch  monitor will be mailed 3 day USPS to your address on file. It may take 3-5 days  to receive your monitor after you have been enrolled.  Once you have received your monitor, please review the enclosed instructions. Your monitor  has already been registered assigning a specific monitor serial # to you.  Billing and Patient Assistance Program Information  We have supplied [employer] with any of your insurance information on file for billing purposes. [employer] offers a sliding scale Patient Assistance Program for patients that do not have  insurance, or whose insurance does not completely cover the cost of the ZIO monitor.  You must apply for the Patient Assistance Program to qualify for this discounted rate.  To apply, please call [employer] at [phone], select option 4, select option 2, ask to apply for  Patient Assistance Program. Theodore Demark will ask your household income, and how many people  are in your household. They will quote your out-of-pocket cost based on that information.  [employer] will also be able to set up a 30-month interest-free payment plan if needed.  Applying the monitor   Shave hair from upper left chest.  Hold abrader disc by orange tab. Rub abrader in 40 strokes over the upper left chest as  indicated in your monitor instructions.  Clean area with 4 enclosed alcohol pads. Let dry.  Apply patch as indicated in monitor instructions. Patch will be placed under collarbone on left  side of chest with arrow pointing upward.  Rub patch adhesive wings for 2  minutes. Remove white label marked "1". Remove the white  label marked "2". Rub patch adhesive wings for 2 additional minutes.  While looking in a mirror, press and release button in center of patch. A small green light will  flash 3-4 times. This will be your only indicator that the monitor has been turned on.  Do not shower for the first 24 hours. You may shower after the first 24 hours.  Press the button if you feel a symptom. You will hear a small click. Record Date, Time and  Symptom in the Patient Logbook.  When you are ready to remove the patch, follow instructions on the last 2 pages of Patient  Logbook. Stick patch monitor onto the last page of Patient Logbook.  Place Patient Logbook in the blue and white box. Use locking tab on box and tape box closed  securely. The blue and white box has prepaid postage on it. Please place it in the mailbox as  soon as possible. Your physician should have your test results approximately 7 days after the  monitor has [facility]  Call [place] [phone] you have questions regarding  your ZIO XT patch monitor. Call them immediately if you see an orange light blinking on your  monitor.  If your monitor falls off in less than 4 days, contact our Monitor department at [phone]  If your monitor becomes loose or falls off after 4 days call [employer] at [phone]  suggestions on securing your monitor  Follow-Up: [facility] and your [facility] needs are our [facility].  As part of our continuing mission to provide you with exceptional heart care, we have created designated Provider Care Teams.  These Care Teams include your primary Cardiologist (physician) and Advanced Practice Providers (APPs -  Physician Assistants and Nurse Practitioners) who all work together to provide you with the care you need, when you need it.  We  recommend signing up for the patient portal called  "MyChart".  Sign up information is provided on this After Visit Summary.  MyChart is used to connect with patients for Virtual Visits (Telemedicine).  Patients are able to view lab/test results, encounter notes, upcoming appointments, etc.  Non-urgent messages can be sent to your provider as well.   To learn more about what you can do with MyChart, go to [email or web address].    Your next appointment:   1 year(s)  The format for your next appointment:   In Person  Provider:   Sanda Klein, MD     Other Instructions [email or web address] [email or web address]        FDA-cleared, clinical grade mobile EKG monitor: Jodelle Red is the most clinically-validated mobile EKG used by the world's leading cardiac care medical professionals With Basic service, know instantly if your heart rhythm is normal or if atrial fibrillation is detected, and email the last single EKG recording to yourself or your doctor Premium service, available for purchase through the Kardia app for $9.99 per month or $99 per year, includes unlimited history and storage of your EKG recordings, a monthly EKG summary report to share with your doctor, along with the ability to track your blood pressure, activity and weight Includes one KardiaMobile phone clip FREE SHIPPING: Standard delivery 1-3 business days. Orders placed by 11:00am PST will ship that afternoon. Otherwise, will ship next business day. All orders ship via [employer] from [place], [place] - sending an EKG [employer] and set up profile. Run EKG - by placing 1-2 fingers on the silver plates After EKG is complete - Download PDF  - Skip password (if you apply a password the provider will need it to view the EKG) Click share button (square with upward arrow) in bottom left corner To send: choose MyChart (first time log into MyChart)  Pop up window about sending ECG Click continue Choose type of message Choose  provider Type subject and message Click send (EKG should be attached)  - To send additional EKGs in one message click the paperclip image and bottom of page to attach.     Important Information About Sugar         Signed, Sanda Klein, MD  07/29/2022 9:45 AM    [place]

## 2022-08-08 ENCOUNTER — Other Ambulatory Visit: Payer: Self-pay | Admitting: Orthopaedic Surgery

## 2022-08-08 DIAGNOSIS — R002 Palpitations: Secondary | ICD-10-CM

## 2022-08-08 DIAGNOSIS — M25561 Pain in right knee: Secondary | ICD-10-CM

## 2022-08-23 ENCOUNTER — Other Ambulatory Visit: Payer: 59

## 2022-08-25 ENCOUNTER — Other Ambulatory Visit (HOSPITAL_COMMUNITY): Payer: 59

## 2022-12-19 ENCOUNTER — Telehealth: Payer: Self-pay | Admitting: Emergency Medicine

## 2022-12-19 NOTE — Telephone Encounter (Signed)
I would encourage her to get the echo since it is possible that she has a congenital abnormality (atrial septal defect that can be seen in correlation with her polydactyly). It is not urgent, but should be done.    I called patient to give the message above. No answer, I left pt message and callback number to get Echo scheduled.

## 2022-12-19 NOTE — Telephone Encounter (Signed)
Called pt and asked about Echocardiogram that was ordered in November. Pt states that she wore the monitor, but does not feel like she needs the Echo done. She is doing well. Will let provider know.

## 2022-12-25 ENCOUNTER — Other Ambulatory Visit: Payer: Self-pay | Admitting: Psychiatry

## 2022-12-26 NOTE — Telephone Encounter (Signed)
Last seen on 07/26/21 Follow up scheduled on 01/11/23 Rx last filled on Ubrelvy 100 mg 05/31/22 # 30 tablet

## 2023-01-11 ENCOUNTER — Ambulatory Visit: Payer: 59 | Admitting: Psychiatry

## 2023-02-22 ENCOUNTER — Encounter: Payer: Self-pay | Admitting: Psychiatry

## 2023-04-05 ENCOUNTER — Ambulatory Visit: Payer: 59 | Admitting: Psychiatry

## 2023-04-05 VITALS — BP 140/94 | HR 96 | Ht 70.0 in | Wt 257.0 lb

## 2023-04-05 DIAGNOSIS — G43019 Migraine without aura, intractable, without status migrainosus: Secondary | ICD-10-CM

## 2023-04-05 MED ORDER — BACLOFEN 10 MG PO TABS: 10.0000 mg | ORAL_TABLET | Freq: Every day | ORAL | 6 refills | Status: DC

## 2023-04-05 MED ORDER — PROPRANOLOL HCL 20 MG PO TABS: 20.0000 mg | ORAL_TABLET | Freq: Two times a day (BID) | ORAL | 6 refills | Status: DC

## 2023-04-05 MED ORDER — NURTEC 75 MG PO TBDP: 75.0000 mg | ORAL_TABLET | ORAL | 6 refills | Status: DC | PRN

## 2023-04-05 NOTE — Progress Notes (Signed)
   CC:  headaches  Follow-up Visit  Last visit: 07/26/21  Brief HPI: 42 year old female who previously followed with Dr. Anne Hahn for migraines which have been present since she was a teenager.   At her last visit she was started on Zonisamide for migraine prevention and Ubrelvy for rescue. She was referred to neck PT for cervicalgia.  Interval History: Headaches are about the same since her last visit. She is currently averaging 2 migraines per week. She did not start Zonisamide due to concern for side effects. Ruel Favors and Flexeril together which helps somewhat but makes her drowsy. Is not sure Bernita Raisin is helpful by itself.   Migraine days per month: 8 Headache free days per month: 22  Current Headache Regimen: Preventative: none Abortive: Ubrelvy, flexeril   Prior Therapies                                  Lisinopril - allergy zonisamide Gabapentin 300 mg BID Flexeril 5 mg QHS Imitrex 25-50 mg PRN Maxalt 10 mg PRN Ubrelvy 100 mg PRN  Physical Exam:   Vital Signs: BP (!) 156/116 (BP Location: Right Arm, Patient Position: Sitting, Cuff Size: Large)   Pulse (!) 108   Ht 5\' 10"  (1.778 m)   Wt 257 lb (116.6 kg)   BMI 36.88 kg/m  GENERAL:  well appearing, in no acute distress, alert  SKIN:  Color, texture, turgor normal. No rashes or lesions HEAD:  Normocephalic/atraumatic. RESP: normal respiratory effort MSK:  No gross joint deformities.   NEUROLOGICAL: Mental Status: Alert, oriented to person, place and time, Follows commands, and Speech fluent and appropriate. Cranial Nerves: PERRL, face symmetric, no dysarthria, hearing grossly intact Motor: moves all extremities equally Gait: normal-based.  IMPRESSION: 42 year old female who presents for follow up of migraines. She continues to have frequent migraines which are poorly controlled. Discussed medication options, and she is hesitant to take many medications due to concern for side effects. Will start propranolol  for migraine prevention. Bernita Raisin is ineffective, will switch to Nurtec for rescue. She finds Flexeril to be helpful but sedating. Will switch to baclofen to see if this is better tolerated.  PLAN: -Prevention: start propranolol 20 mg BID -Rescue: Start Nurtec 75 mg PRN, baclofen 10 mg at bedtime PRN -Supplement information provided   Follow-up: 4 months  I spent a total of 30 minutes on the date of the service. Headache education was done. Discussed treatment options including preventive and acute medications, and natural supplements. Discussed medication side effects, adverse reactions and drug interactions. Written educational materials and patient instructions outlining all of the above were given.  Ocie Doyne, MD 04/05/23 2:26 PM

## 2023-04-05 NOTE — Patient Instructions (Addendum)
Start propranolol 20 mg twice a day for headache prevention   Start Nurtec as needed for migraines. Stop ubrelvy.  Start baclofen 10 mg at bedtime as needed for muscle tension/headaches. Stop cyclobenzaprine  Natural supplements that can reduce migraines: Magnesium Oxide or Magnesium Glycinate 500 mg at bed Coenzyme Q10 300 mg in AM Vitamin B2- 200 mg twice a day  Add 1 supplement at a time since even natural supplements can have undesirable side effects. You can sometimes buy supplements cheaper (especially Coenzyme Q10) at www.WebmailGuide.co.za or at [employer].  Magnesium: Magnesium (250 mg twice a day or 500 mg at bed) has a relaxant effect on smooth muscles such as blood vessels. Individuals suffering from frequent or daily headache usually have low magnesium levels which can be increase with daily supplementation of 400-800 mg. Three trials found 40-90% average headache reduction  when used as a preventative. Magnesium also demonstrated the benefit in menstrually related migraine.  Magnesium is part of the messenger system in the serotonin cascade and it is a good muscle relaxant.  It is also useful for constipation. Good sources include nuts, whole grains, and tomatoes. Side Effects: loose stool/diarrhea  Riboflavin (vitamin B 2) 200 mg twice a day. This vitamin assists nerve cells in the production of ATP a principal energy storing molecule.  It is necessary for many chemical reactions in the body.  There have been at least 3 clinical trials of riboflavin using 400 mg per day all of which suggested that migraine frequency can be decreased.  All 3 trials showed significant improvement in over half of migraine sufferers.  The supplement is found in bread, cereal, milk, meat, and poultry.  Most Americans get more riboflavin than the recommended daily allowance, however riboflavin deficiency is not necessary for the supplements to help prevent headache. Side effects: energizing, green urine  Coenzyme Q10:  This is present in almost all cells in the body and is critical component for the conversion of energy.  Recent studies have shown that a nutritional supplement of CoQ10 can reduce the frequency of migraine attacks by improving the energy production of cells as with riboflavin.  Doses of 150 mg twice a day or 300 mg in the morning have been shown to be effective.

## 2023-04-13 ENCOUNTER — Ambulatory Visit: Payer: 59 | Admitting: Psychiatry

## 2023-06-15 ENCOUNTER — Telehealth: Payer: Self-pay | Admitting: Psychiatry

## 2023-06-15 DIAGNOSIS — Z0289 Encounter for other administrative examinations: Secondary | ICD-10-CM

## 2023-06-15 NOTE — Telephone Encounter (Signed)
That's fine, I can fill that out for her

## 2023-06-15 NOTE — Telephone Encounter (Signed)
Dr. Delena Bali, do you approve this request?

## 2023-06-15 NOTE — Telephone Encounter (Signed)
Stanton Kidney, can you let her know MD approved and get form/payment from her? Thank you

## 2023-06-15 NOTE — Telephone Encounter (Signed)
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 Would like to know if can drop off document to be fill out by Dr. Delena Bali.

## 2023-06-19 NOTE — Telephone Encounter (Signed)
Gave completed/signed form back to medical records to process for pt. 

## 2023-07-12 ENCOUNTER — Encounter (INDEPENDENT_AMBULATORY_CARE_PROVIDER_SITE_OTHER): Payer: Self-pay | Admitting: Otolaryngology

## 2023-08-02 ENCOUNTER — Ambulatory Visit: Payer: 59 | Admitting: Adult Health

## 2023-08-28 ENCOUNTER — Institutional Professional Consult (permissible substitution) (INDEPENDENT_AMBULATORY_CARE_PROVIDER_SITE_OTHER): Payer: 59 | Admitting: Otolaryngology

## 2023-09-18 NOTE — Progress Notes (Signed)
 CC:  headaches   Follow-up Visit  Last visit: 04/05/2023 [name]  Brief HPI: 42 year old female who previously followed with [name] for migraines which have been present since she was a teenager.   At her last visit, she was started on propranolol  20 mg twice daily for prevention and Nurtec and baclofen  as needed   Interval History:  Patient returns for follow-up visit.  She never started propranolol  due to concern of side effects.  She never started Nurtec or baclofen  also due to concern of side effects.  She has continued on Ubrelvy  and Flexeril .  She will take Ubrelvy  at onset of migraine and if migraine persists after a few hours, she will use Flexeril . Ubrelvy  typically takes the edge off and has mild headache by the following day which gradually dissipates. Reports about 3 migraines in the month of November and 5-6 migraines this month, typically lasting 2-3 days. Has been previously associated with nausea but not recently.  She is very hesitant to trial other medications due to potential side effect concerns especially ones with potential cognitive side effects.    Migraine days per month: 6-18  Current Headache Regimen: Preventative: none Abortive: Ubrelvy , Flexeril    Prior Therapies                                  Lisinopril - allergy zonisamide  Gabapentin  300 mg BID Propranolol  20mg  BID Flexeril  5 mg QHS Imitrex 25-50 mg PRN Maxalt  10 mg PRN Ubrelvy  100 mg PRN Nurtec Baclofen     Current Outpatient Medications on File Prior to Visit  Medication Sig Dispense Refill   Azelastine HCl 0.15 % SOLN Place 1 spray into the nose daily.     EPINEPHrine 0.3 mg/0.3 mL IJ SOAJ injection Inject 0.3 mg into the muscle daily as needed (anaphylactic allergic reaction.).      linaclotide (LINZESS) 290 MCG CAPS capsule Take 580 mcg by mouth every other day. In the afternoon.     Rimegepant Sulfate (NURTEC) 75 MG TBDP Take 1 tablet (75 mg total) by mouth as needed. 8  tablet 6   No current facility-administered medications on file prior to visit.   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   Migraine    Prolonged posttraumatic stress disorder 08/08/2018   Past Surgical History:  Procedure Laterality Date   HYSTEROSCOPY WITH D & C N/A 09/26/2016   Procedure: DILATATION AND CURETTAGE /HYSTEROSCOPY WITH POLYPECTOMY;  Surgeon: [name], MD;  Location: [facility];  Service: Gynecology;  Laterality: N/A;   INTRAUTERINE DEVICE (IUD) INSERTION N/A 09/26/2016   Procedure: INTRAUTERINE DEVICE (IUD) INSERTION;  Surgeon: [name], MD;  Location: [facility];  Service: Gynecology;  Laterality: N/A;   IUD REMOVAL     ORIF ANKLE FRACTURE Right 08/27/2018   ORIF ANKLE FRACTURE Right 08/27/2018   Procedure: OPEN REDUCTION INTERNAL FIXATION (ORIF) ANKLE FRACTURE;  Surgeon: [name],  MD;  Location: [facility];  Service: Orthopedics;  Laterality: Right;   ROBOTIC ASSISTED LAPAROSCOPIC OVARIAN CYSTECTOMY Right 09/26/2016   Procedure: ROBOTIC ASSISTED LAPAROSCOPIC OVARIAN CYSTECTOMY, PELVIC WASHINGS;  Surgeon: [name], MD;  Location: [facility];  Service: Gynecology;  Laterality: Right;     Physical Exam:   Vital Signs: BP (!) 138/92 (BP Location: Right Arm, Patient Position: Sitting, Cuff Size: Normal)   Pulse (!) 101   Ht 5' 10 (1.778 m)   Wt 258 lb (117 kg)   BMI 37.02 kg/m  GENERAL:  well appearing, in no acute distress, alert  SKIN:  Color, texture, turgor normal. No rashes or lesions HEAD:  Normocephalic/atraumatic. RESP:  normal respiratory effort MSK:  No gross joint deformities.   NEUROLOGICAL: Mental Status: Alert, oriented to person, place and time, Follows commands, and Speech fluent and appropriate. Cranial Nerves: PERRL, face symmetric, no dysarthria, hearing grossly intact Motor: moves all extremities equally Gait: normal-based.      IMPRESSION: 42 year old female who returns for follow up of migraines.  She continues to have 3-6 migraines per month which can last 2 to 3 days. She is very hesitant to trial preventative therapies or change current rescue therapy due to potential side effects. Ubrelvy  and Flexeril  typically work well to take the edge off. She wishes to continue this regimen for now.   PLAN: -Prevention: none per patient preference, discussed trial of CGRP's which typically come with less side effects compared to other migraine preventative medications but declines interest at this time -Rescue: Continue Ubrelvy  as needed and Flexeril  as needed, she was provided samples of Nurtec to use as rescue to see if this provides greater benefit. She was advised to call if beneficial for prescription. If tolerates well, could also consider using Nurtec as prevention taking every other day. She is aware not to take Ubrelvy  and Nurtec together.      Follow up in 1 year or call earlier if needed    I spent 25 minutes of face-to-face and non-face-to-face time with patient.  This included previsit chart review, lab review, study review, order entry, electronic health record documentation, patient education and discussion regarding above diagnoses and treatment plan and answered all other questions to patient's satisfaction  Harlene Bogaert, [facility]  [facility] [address] [place], [place] [address]  Phone [phone] Fax [phone] Note: This document was prepared with digital dictation and possible smart phrase technology. Any transcriptional errors  that result from this process are unintentional.

## 2023-09-19 ENCOUNTER — Encounter: Payer: Self-pay | Admitting: Adult Health

## 2023-09-19 ENCOUNTER — Ambulatory Visit: Payer: 59 | Admitting: Adult Health

## 2023-09-19 VITALS — BP 138/92 | HR 101 | Ht 70.0 in | Wt 258.0 lb

## 2023-09-19 DIAGNOSIS — G43709 Chronic migraine without aura, not intractable, without status migrainosus: Secondary | ICD-10-CM | POA: Diagnosis not present

## 2023-09-19 MED ORDER — UBRELVY 100 MG PO TABS: 100.0000 mg | ORAL_TABLET | ORAL | 11 refills | Status: DC | PRN

## 2023-09-19 MED ORDER — NURTEC 75 MG PO TBDP: 75.0000 mg | ORAL_TABLET | Freq: Every day | ORAL | Status: DC | PRN

## 2023-09-19 MED ORDER — CYCLOBENZAPRINE HCL 5 MG PO TABS: 10.0000 mg | ORAL_TABLET | Freq: Every evening | ORAL | 11 refills | Status: DC | PRN

## 2023-09-19 NOTE — Patient Instructions (Addendum)
 Your Plan:  Continue Ubrelvy  as needed, can repeat after 2 hours if needed, max dose 200mg /24 hours  Continue Flexeril  as needed  Try Nurtec as needed, do not combine with Ubrelvy , if helpful please let me know and I will place order. We can also consider using Nurtec as a preventative if you wish which is taken every other day.      Follow up in 1 year or call earlier if needed     Thank you for coming to see us  at [facility]. I hope we have been able to provide you high quality care today.  You may receive a patient satisfaction survey over the next few weeks. We would appreciate your feedback and comments so that we may continue to improve ourselves and the health of our patients.

## 2023-09-29 ENCOUNTER — Institutional Professional Consult (permissible substitution) (INDEPENDENT_AMBULATORY_CARE_PROVIDER_SITE_OTHER): Payer: 59 | Admitting: Otolaryngology

## 2023-10-04 ENCOUNTER — Other Ambulatory Visit: Payer: Self-pay | Admitting: Adult Health

## 2023-10-09 ENCOUNTER — Telehealth: Payer: Self-pay

## 2023-10-09 ENCOUNTER — Other Ambulatory Visit (HOSPITAL_COMMUNITY): Payer: Self-pay

## 2023-10-09 NOTE — Telephone Encounter (Signed)
Pharmacy Patient Advocate Encounter   Received notification from RX Request Messages that prior authorization for Ubrelvy 100MG  tablets is required/requested.   Insurance verification completed.   The patient is insured through [facility] .   Per test claim: PA required; PA submitted to above mentioned insurance via CoverMyMeds Key/confirmation #/EOC [facility] Status is pending

## 2023-10-11 ENCOUNTER — Other Ambulatory Visit (HOSPITAL_COMMUNITY): Payer: Self-pay

## 2023-10-11 NOTE — Telephone Encounter (Signed)
Pt aware Ubrevly approved

## 2023-10-11 NOTE — Telephone Encounter (Signed)
Pharmacy Patient Advocate Encounter  Received notification from [facility] that Prior Authorization for Ubrelvy 100MG  tablets has been APPROVED from 10/09/2023 to 10/08/2024. Ran test claim, Copay is $0. This test claim was processed through [facility]- copay amounts may vary at other pharmacies due to pharmacy/plan contracts, or as the patient moves through the different stages of their insurance plan.   PA #/Case ID/Reference #: PA Case ID #: [id number]

## 2023-11-20 ENCOUNTER — Telehealth: Payer: Self-pay | Admitting: Pharmacist

## 2023-11-20 NOTE — Telephone Encounter (Signed)
 Pharmacy Patient Advocate Encounter   Received notification from Patient Pharmacy that prior authorization for Nurtec 75MG  dispersible tablets is required/requested.   Insurance verification completed.   The patient is insured through [facility] .   Per test claim: PA required; PA submitted to above mentioned insurance via CoverMyMeds Key/confirmation #/EOC [id number] Status is pending

## 2023-11-22 NOTE — Telephone Encounter (Signed)
 Pharmacy Patient Advocate Encounter  Received notification from [facility] that Prior Authorization for Nurtec 75MG  dispersible tablets has been APPROVED from 11/22/2023 to 11/21/2024   PA #/Case ID/Reference #: [id number]

## 2024-03-25 ENCOUNTER — Other Ambulatory Visit: Payer: Self-pay | Admitting: Obstetrics and Gynecology

## 2024-03-25 DIAGNOSIS — Z1231 Encounter for screening mammogram for malignant neoplasm of breast: Secondary | ICD-10-CM

## 2024-05-30 ENCOUNTER — Encounter: Payer: Self-pay | Admitting: Adult Health

## 2024-05-30 ENCOUNTER — Telehealth: Admitting: Adult Health

## 2024-05-30 DIAGNOSIS — G43709 Chronic migraine without aura, not intractable, without status migrainosus: Secondary | ICD-10-CM

## 2024-05-30 DIAGNOSIS — G43009 Migraine without aura, not intractable, without status migrainosus: Secondary | ICD-10-CM | POA: Diagnosis not present

## 2024-05-30 MED ORDER — UBRELVY 100 MG PO TABS: 100.0000 mg | ORAL_TABLET | ORAL | 11 refills | Status: AC | PRN

## 2024-05-30 MED ORDER — CYCLOBENZAPRINE HCL 5 MG PO TABS
10.0000 mg | ORAL_TABLET | Freq: Every evening | ORAL | 11 refills | Status: AC | PRN
Start: 2024-05-30 — End: ?

## 2024-05-30 MED ORDER — AMITRIPTYLINE HCL 10 MG PO TABS
10.0000 mg | ORAL_TABLET | Freq: Every day | ORAL | 5 refills | Status: DC
Start: 2024-05-30 — End: 2024-06-26

## 2024-05-30 NOTE — Progress Notes (Signed)
 CC:  headaches  Virtual Visit via Video Note  I connected with Deanna Ballard on 05/30/24 at  3:45 PM EDT by a video enabled telemedicine application and verified that I am speaking with the correct person using two identifiers.  Location: Patient: home Provider: in office, [facility]   I discussed the limitations of evaluation and management by telemedicine and the availability of in person appointments. The patient expressed understanding and agreed to proceed.     Follow-up Visit  Last visit: 06/15/2023   Brief HPI: 43 year old female who previously followed with [name] for migraines which have been present since she was a teenager.   At her last visit, reported 6-18 migraine days per month but hesitant to trial preventative therapy due to side effects.  She remained on Ubrelvy  as needed and Flexeril  as needed for rescue per patient request.   Interval History:  Patient returns for follow-up visit requesting renewal of accommodation forms.  Prior accommodation forms completed by [name] requesting working from home 4 days/week expires 06/18/2024.   She reports improvement of migraine headaches since working at home more frequently, she has about 3 migraine days per month that are debilitating but can have milder headaches about 2-3 times per week. Headaches can start behind her right eye or can start with increased tension in the back of her head/neck and radiate towards top of head, at times will progress into migraine. Use of Ubrelvy  and Flexeril  with benefit. 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      Migraine days per month: >15/month, 8-12 mild, 3 migrainous  Current Headache Regimen: Preventative: none Abortive:  Ubrelvy , Flexeril    Prior Therapies                                  Lisinopril - allergy zonisamide  Gabapentin  300 mg BID Propranolol  20mg  BID Duloxetine  Flexeril  5 mg QHS Imitrex 25-50 mg PRN Maxalt  10 mg PRN Ubrelvy  100 mg PRN Nurtec Baclofen     ROS:   14 system review of systems performed and negative with exception of those listed in HPI    Current Outpatient Medications on File Prior to Visit  Medication Sig Dispense Refill   Azelastine HCl 0.15 % SOLN Place 1 spray into the nose daily.     cyclobenzaprine  (FLEXERIL ) 5 MG tablet Take 2 tablets (10 mg total) by mouth at bedtime as needed (migraine headaches). 30 tablet 11   EPINEPHrine 0.3 mg/0.3 mL IJ SOAJ injection Inject 0.3 mg into the muscle daily as needed (anaphylactic allergic reaction.).      linaclotide (LINZESS) 290 MCG CAPS capsule Take 580 mcg by mouth every other day. In the afternoon.     Rimegepant Sulfate (NURTEC) 75 MG TBDP Take 1 tablet (75 mg total) by mouth daily as needed (migraine).     Ubrogepant  (UBRELVY ) 100 MG TABS Take 1 tablet (100 mg total) by mouth as needed. Can repeat after 2 hrs if needed, max dose 200mg /24hrs 16 tablet 11   No current facility-administered medications on file prior to visit.   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   Migraine    Prolonged posttraumatic stress disorder 08/08/2018   Past Surgical History:  Procedure Laterality Date   HYSTEROSCOPY WITH D & C N/A 09/26/2016   Procedure: DILATATION AND CURETTAGE /HYSTEROSCOPY WITH  POLYPECTOMY;  Surgeon: [name], MD;  Location: [facility];  Service: Gynecology;  Laterality: N/A;   INTRAUTERINE DEVICE (IUD) INSERTION N/A 09/26/2016   Procedure: INTRAUTERINE DEVICE (IUD) INSERTION;  Surgeon: [name], MD;  Location: [facility];  Service: Gynecology;  Laterality: N/A;   IUD REMOVAL     ORIF ANKLE FRACTURE Right 08/27/2018   ORIF ANKLE FRACTURE Right 08/27/2018   Procedure: OPEN REDUCTION INTERNAL FIXATION (ORIF) ANKLE FRACTURE;  Surgeon: [name], MD;  Location: [facility] OR;  Service: Orthopedics;  Laterality: Right;   ROBOTIC ASSISTED LAPAROSCOPIC OVARIAN CYSTECTOMY Right 09/26/2016   Procedure: ROBOTIC ASSISTED LAPAROSCOPIC OVARIAN CYSTECTOMY, PELVIC WASHINGS;  Surgeon: [name], MD;  Location: [facility];  Service: Gynecology;  Laterality: Right;     Physical Exam:   GENERAL:  well appearing, in no acute distress, alert   NEUROLOGICAL: Mental Status: Alert, oriented to person, place and time, Follows commands, and Speech fluent and appropriate.        IMPRESSION: 43 year old female who returns for follow up of migraines.  She continues to have >15 headache days per month, 3 of which are migrainous. Use of Ubrelvy  and Flexeril  with benefit. She is agreeable to try preventative treatment at this time.     PLAN: -Prevention: Recommend initiating amitriptyline  10 mg nightly, discussed potential side effects, starting low dose to help risk of side effects, advised to call after 2 to 3 weeks for possible need of dosage increase.  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 She will send forms via MyChart to have completed.      Follow up in 1 year or call earlier if  needed     I personally spent a total of 25 minutes in the care of the patient today including preparing to see the patient, counseling and educating, placing orders, and documenting clinical information in the EHR.   Harlene Bogaert, AGNP-BC  [facility] [address] [place], [place] [address]  Phone [phone] Fax [phone] Note: This document was prepared with digital dictation and possible smart phrase technology. Any transcriptional errors that result from this process are unintentional.

## 2024-06-07 ENCOUNTER — Encounter: Payer: Self-pay | Admitting: Adult Health

## 2024-06-11 NOTE — Telephone Encounter (Signed)
 Yes, this was noted in my recent note. Thank you.

## 2024-06-14 ENCOUNTER — Telehealth: Payer: Self-pay | Admitting: Adult Health

## 2024-06-14 NOTE — Telephone Encounter (Signed)
 Pt called and made payment for the form. Pt would like a copy of the forms emailed to her at her work email [email or web address].

## 2024-06-14 NOTE — Telephone Encounter (Signed)
 error

## 2024-06-17 DIAGNOSIS — Z0289 Encounter for other administrative examinations: Secondary | ICD-10-CM

## 2024-06-26 ENCOUNTER — Other Ambulatory Visit: Payer: Self-pay | Admitting: Adult Health

## 2024-06-27 NOTE — Telephone Encounter (Signed)
 The form signed. To medical records.

## 2024-07-05 ENCOUNTER — Inpatient Hospital Stay: Admission: RE | Admit: 2024-07-05

## 2024-08-05 ENCOUNTER — Ambulatory Visit

## 2024-09-09 ENCOUNTER — Telehealth: Payer: Self-pay | Admitting: Pharmacist

## 2024-09-09 NOTE — Telephone Encounter (Signed)
 Pharmacy Patient Advocate Encounter  Received notification from [facility] that Prior Authorization for Ubrogepant  (UBRELVY ) 100 MG TABS has been APPROVED from 09/09/2024 to 09/09/2025   PA #/Case ID/Reference #: [id number]

## 2024-09-09 NOTE — Telephone Encounter (Signed)
 Pharmacy Patient Advocate Encounter   Received notification from CoverMyMeds that prior authorization for Ubrelvy  100MG  tablets is required/requested.   Insurance verification completed.   The patient is insured through [facility].   Per test claim: PA required; PA submitted to above mentioned insurance via Latent Key/confirmation #/EOC [id number] Status is pending

## 2024-09-18 NOTE — Progress Notes (Signed)
 "  CC:  headaches    Follow-up Visit  Last visit: 05/30/2024   Brief HPI: 43 year old female who previously followed with [name] for migraines which have been present since she was a teenager.   At her last visit, reported >15 headache days per month, was started on amitriptyline  10 mg nightly for prevention and continued Ubrelvy  and Flexeril  as needed.  Updated accommodation form completed.   Interval History:  Patient returns for follow-up visit.  Reports headaches currently well-managed.  She reports 4 headache days over the past month with 3 of those headaches transitioning into a migrainous headache. Can last 1-2 days. She has been taking amitriptyline  and Ubrelvy  at onset of migraine and the following day will repeat Ubrelvy  in addition to Flexeril  which seems to work well to reduce migraine severity and duration.  She has not tried to take amitriptyline  nightly.  She also recently started magnesium glycinate supplement but does not take daily, will use menthol inhaler to help with nausea.  Bright fluorescent lights continue to trigger headache.  She continues to work at home 5 days/week. Blood pressure elevated today but typically well controlled. Advised to f/u with PCP if remains elevated.      Migraine days per month: 8 headache days, 4 migraine days  Current Headache Regimen: Preventative: none Abortive: Amitriptyline , Ubrelvy , Flexeril    Prior Therapies                                  Lisinopril - allergy zonisamide  Gabapentin  300 mg BID Propranolol  20mg  BID Duloxetine  Amitriptyline  10 mg qHS Flexeril  5 mg QHS Imitrex 25-50 mg PRN Maxalt  10 mg PRN Ubrelvy  100 mg PRN Nurtec Baclofen     ROS:   14 system review of systems performed and negative with exception of those listed in HPI    Current Outpatient Medications on File Prior to Visit  Medication Sig Dispense Refill   amitriptyline  (ELAVIL ) 10 MG tablet TAKE 1 TABLET BY MOUTH EVERYDAY AT BEDTIME 90  tablet 2   Azelastine HCl 0.15 % SOLN Place 1 spray into the nose daily.     cyclobenzaprine  (FLEXERIL ) 5 MG tablet Take 2 tablets (10 mg total) by mouth at bedtime as needed (migraine headaches). 30 tablet 11   EPINEPHrine 0.3 mg/0.3 mL IJ SOAJ injection Inject 0.3 mg into the muscle daily as needed (anaphylactic allergic reaction.).      linaclotide (LINZESS) 290 MCG CAPS capsule Take 580 mcg by mouth every other day. In the afternoon.     Ubrogepant  (UBRELVY ) 100 MG TABS Take 1 tablet (100 mg total) by mouth as needed. Can repeat after 2 hrs if needed, max dose 200mg /24hrs 16 tablet 11   No current facility-administered medications on file prior to visit.   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   Migraine    Prolonged posttraumatic stress disorder 08/08/2018   Past Surgical History:  Procedure Laterality Date   HYSTEROSCOPY WITH D & C N/A 09/26/2016   Procedure: DILATATION AND CURETTAGE /HYSTEROSCOPY WITH POLYPECTOMY;  Surgeon: [name], MD;  Location: [facility];  Service: Gynecology;  Laterality: N/A;   INTRAUTERINE DEVICE (IUD) INSERTION N/A 09/26/2016   Procedure: INTRAUTERINE DEVICE (IUD) INSERTION;  Surgeon: [name], MD;  Location: [facility];  Service: Gynecology;  Laterality: N/A;   IUD REMOVAL     ORIF ANKLE FRACTURE Right 08/27/2018   ORIF ANKLE FRACTURE Right 08/27/2018   Procedure: OPEN REDUCTION INTERNAL FIXATION (ORIF) ANKLE FRACTURE;  Surgeon: [name], MD;  Location: [facility];  Service: Orthopedics;  Laterality: Right;   ROBOTIC  ASSISTED LAPAROSCOPIC OVARIAN CYSTECTOMY Right 09/26/2016   Procedure: ROBOTIC ASSISTED LAPAROSCOPIC OVARIAN CYSTECTOMY, PELVIC WASHINGS;  Surgeon: [name], MD;  Location: [facility];  Service: Gynecology;  Laterality: Right;     Physical Exam:  Today's Vitals   09/23/24 0741  BP: (!) 152/102  Pulse: (!) 108  Weight: 278 lb 6.4 oz (126.3 kg)  Height: 5' 10 (1.778 m)   Body mass index is 39.95 kg/m.  General: well developed, well nourished, very pleasant middle-age female, seated, in no evident distress  Neurologic Exam Mental Status: Awake and fully alert. Oriented to place and time. Recent and remote memory intact. Attention span, concentration and fund of knowledge appropriate. Mood and affect appropriate.  Cranial Nerves: Extraocular movements full without nystagmus. Visual fields full to confrontation. Hearing intact. Facial sensation intact. Face, tongue, palate moves normally and symmetrically.  Motor: Normal bulk and tone. Normal strength in all tested extremity muscles Gait and Station: Arises from chair without difficulty. Stance is normal. Gait demonstrates normal stride length and balance.           IMPRESSION: 43 year old female who returns for follow up of migraines.  Migraines spontaneously improved over the past several months with only 4 headache days over the past 30 days with 3 of those days being migraines. Currently using amitriptyline  and Ubrelvy  at onset of migraine and will repeat Ubrelvy  with Flexeril  the following day.     PLAN: -Prevention: Recommend trying to take amitriptyline  10 mg nightly as she is currently using PRN. Recommend taking magnesium glycinate daily to maximize benefit.  -Rescue: Continue Ubrelvy  as needed and Flexeril  as needed - next step: CGRP - work accomodation working at home 5 days per week effective until 05/2025 - recommend f/u with ophthalmology for routine exam      Follow up in 6-8 months or call earlier if  needed       Harlene Bogaert, [facility]  [facility] [address] [place], [place] [address]  Phone [phone] Fax [phone] Note: This document was prepared with digital dictation and possible smart phrase technology. Any transcriptional errors that result from this process are unintentional.   "

## 2024-09-23 ENCOUNTER — Ambulatory Visit: Payer: 59 | Admitting: Adult Health

## 2024-09-23 ENCOUNTER — Encounter: Payer: Self-pay | Admitting: Adult Health

## 2024-09-23 VITALS — BP 152/102 | HR 108 | Ht 70.0 in | Wt 278.4 lb

## 2024-09-23 DIAGNOSIS — G43709 Chronic migraine without aura, not intractable, without status migrainosus: Secondary | ICD-10-CM | POA: Diagnosis not present

## 2024-09-23 NOTE — Patient Instructions (Addendum)
 Your Plan:  Continue amitriptyline  but recommend you try to take this nightly to further reduce migriane occurrence  Continue Ubrelvy  and Flexeril  as needed for migraine rescue  Continue magnesium, try to take 2 gummies every day to further help reduce migraines  If you feel your migraines start to worsen or not well treated with this above regimen, please let me know!     Follow up in 6-8 months or call earlier if needed     Thank you for coming to see us  at [facility]. I hope we have been able to provide you high quality care today.  You may receive a patient satisfaction survey over the next few weeks. We would appreciate your feedback and comments so that we may continue to improve ourselves and the health of our patients.

## 2024-09-24 ENCOUNTER — Ambulatory Visit

## 2024-10-17 ENCOUNTER — Other Ambulatory Visit (HOSPITAL_COMMUNITY): Payer: Self-pay

## 2025-03-27 ENCOUNTER — Telehealth: Admitting: Adult Health
